# Patient Record
Sex: Female | Born: 1970 | Race: White | Hispanic: No | Marital: Married
Health system: Southern US, Community
[De-identification: ages and names within clinical notes are randomized; demographics above are authoritative.]

---

## 1997-10-08 ENCOUNTER — Other Ambulatory Visit: Admission: RE | Admit: 1997-10-08 | Discharge: 1997-10-08 | Payer: Self-pay | Admitting: Obstetrics and Gynecology

## 1997-12-17 ENCOUNTER — Ambulatory Visit (HOSPITAL_COMMUNITY): Admission: RE | Admit: 1997-12-17 | Discharge: 1997-12-17 | Payer: Self-pay | Admitting: Obstetrics and Gynecology

## 1998-04-01 ENCOUNTER — Inpatient Hospital Stay (HOSPITAL_COMMUNITY): Admission: AD | Admit: 1998-04-01 | Discharge: 1998-04-01 | Payer: Self-pay | Admitting: *Deleted

## 1998-04-04 ENCOUNTER — Inpatient Hospital Stay (HOSPITAL_COMMUNITY): Admission: AD | Admit: 1998-04-04 | Discharge: 1998-04-06 | Payer: Self-pay | Admitting: Obstetrics and Gynecology

## 2001-02-13 ENCOUNTER — Emergency Department (HOSPITAL_COMMUNITY): Admission: EM | Admit: 2001-02-13 | Discharge: 2001-02-13 | Payer: Self-pay | Admitting: *Deleted

## 2019-10-18 ENCOUNTER — Encounter (HOSPITAL_COMMUNITY): Payer: Self-pay

## 2019-10-18 ENCOUNTER — Emergency Department (HOSPITAL_COMMUNITY): Payer: Self-pay

## 2019-10-18 ENCOUNTER — Inpatient Hospital Stay (HOSPITAL_COMMUNITY)
Admission: EM | Admit: 2019-10-18 | Discharge: 2019-10-31 | DRG: 080 | Disposition: E | Payer: Self-pay | Attending: Pulmonary Disease | Admitting: Pulmonary Disease

## 2019-10-18 DIAGNOSIS — Z515 Encounter for palliative care: Secondary | ICD-10-CM | POA: Diagnosis not present

## 2019-10-18 DIAGNOSIS — J9690 Respiratory failure, unspecified, unspecified whether with hypoxia or hypercapnia: Secondary | ICD-10-CM

## 2019-10-18 DIAGNOSIS — I2510 Atherosclerotic heart disease of native coronary artery without angina pectoris: Secondary | ICD-10-CM | POA: Diagnosis present

## 2019-10-18 DIAGNOSIS — A419 Sepsis, unspecified organism: Secondary | ICD-10-CM | POA: Diagnosis not present

## 2019-10-18 DIAGNOSIS — Z529 Donor of unspecified organ or tissue: Secondary | ICD-10-CM

## 2019-10-18 DIAGNOSIS — I610 Nontraumatic intracerebral hemorrhage in hemisphere, subcortical: Secondary | ICD-10-CM | POA: Diagnosis present

## 2019-10-18 DIAGNOSIS — R29810 Facial weakness: Secondary | ICD-10-CM | POA: Diagnosis present

## 2019-10-18 DIAGNOSIS — N390 Urinary tract infection, site not specified: Secondary | ICD-10-CM | POA: Diagnosis not present

## 2019-10-18 DIAGNOSIS — Z526 Liver donor: Secondary | ICD-10-CM

## 2019-10-18 DIAGNOSIS — Z524 Kidney donor: Secondary | ICD-10-CM

## 2019-10-18 DIAGNOSIS — I959 Hypotension, unspecified: Secondary | ICD-10-CM | POA: Diagnosis not present

## 2019-10-18 DIAGNOSIS — Y848 Other medical procedures as the cause of abnormal reaction of the patient, or of later complication, without mention of misadventure at the time of the procedure: Secondary | ICD-10-CM | POA: Diagnosis not present

## 2019-10-18 DIAGNOSIS — G9382 Brain death: Secondary | ICD-10-CM | POA: Diagnosis not present

## 2019-10-18 DIAGNOSIS — R197 Diarrhea, unspecified: Secondary | ICD-10-CM | POA: Diagnosis not present

## 2019-10-18 DIAGNOSIS — J189 Pneumonia, unspecified organism: Secondary | ICD-10-CM

## 2019-10-18 DIAGNOSIS — Z6838 Body mass index (BMI) 38.0-38.9, adult: Secondary | ICD-10-CM

## 2019-10-18 DIAGNOSIS — R1310 Dysphagia, unspecified: Secondary | ICD-10-CM | POA: Diagnosis present

## 2019-10-18 DIAGNOSIS — I629 Nontraumatic intracranial hemorrhage, unspecified: Secondary | ICD-10-CM

## 2019-10-18 DIAGNOSIS — I161 Hypertensive emergency: Secondary | ICD-10-CM | POA: Diagnosis present

## 2019-10-18 DIAGNOSIS — J95851 Ventilator associated pneumonia: Secondary | ICD-10-CM | POA: Diagnosis not present

## 2019-10-18 DIAGNOSIS — B9620 Unspecified Escherichia coli [E. coli] as the cause of diseases classified elsewhere: Secondary | ICD-10-CM | POA: Diagnosis not present

## 2019-10-18 DIAGNOSIS — R29728 NIHSS score 28: Secondary | ICD-10-CM | POA: Diagnosis present

## 2019-10-18 DIAGNOSIS — D751 Secondary polycythemia: Secondary | ICD-10-CM | POA: Diagnosis present

## 2019-10-18 DIAGNOSIS — G936 Cerebral edema: Secondary | ICD-10-CM | POA: Diagnosis present

## 2019-10-18 DIAGNOSIS — G8191 Hemiplegia, unspecified affecting right dominant side: Secondary | ICD-10-CM | POA: Diagnosis present

## 2019-10-18 DIAGNOSIS — E669 Obesity, unspecified: Secondary | ICD-10-CM | POA: Diagnosis present

## 2019-10-18 DIAGNOSIS — I615 Nontraumatic intracerebral hemorrhage, intraventricular: Secondary | ICD-10-CM | POA: Diagnosis present

## 2019-10-18 DIAGNOSIS — Z9911 Dependence on respirator [ventilator] status: Secondary | ICD-10-CM

## 2019-10-18 DIAGNOSIS — E785 Hyperlipidemia, unspecified: Secondary | ICD-10-CM | POA: Diagnosis present

## 2019-10-18 DIAGNOSIS — E876 Hypokalemia: Secondary | ICD-10-CM | POA: Diagnosis present

## 2019-10-18 DIAGNOSIS — J9601 Acute respiratory failure with hypoxia: Secondary | ICD-10-CM | POA: Diagnosis present

## 2019-10-18 DIAGNOSIS — G935 Compression of brain: Principal | ICD-10-CM | POA: Diagnosis present

## 2019-10-18 DIAGNOSIS — B9561 Methicillin susceptible Staphylococcus aureus infection as the cause of diseases classified elsewhere: Secondary | ICD-10-CM | POA: Diagnosis not present

## 2019-10-18 DIAGNOSIS — R030 Elevated blood-pressure reading, without diagnosis of hypertension: Secondary | ICD-10-CM

## 2019-10-18 DIAGNOSIS — Z978 Presence of other specified devices: Secondary | ICD-10-CM

## 2019-10-18 DIAGNOSIS — Z5289 Donor of other specified organs or tissues: Secondary | ICD-10-CM

## 2019-10-18 DIAGNOSIS — I10 Essential (primary) hypertension: Secondary | ICD-10-CM | POA: Diagnosis present

## 2019-10-18 DIAGNOSIS — Z20822 Contact with and (suspected) exposure to covid-19: Secondary | ICD-10-CM | POA: Diagnosis present

## 2019-10-18 DIAGNOSIS — I619 Nontraumatic intracerebral hemorrhage, unspecified: Secondary | ICD-10-CM | POA: Insufficient documentation

## 2019-10-18 DIAGNOSIS — I609 Nontraumatic subarachnoid hemorrhage, unspecified: Secondary | ICD-10-CM | POA: Diagnosis present

## 2019-10-18 DIAGNOSIS — Z66 Do not resuscitate: Secondary | ICD-10-CM | POA: Diagnosis not present

## 2019-10-18 DIAGNOSIS — R339 Retention of urine, unspecified: Secondary | ICD-10-CM | POA: Diagnosis not present

## 2019-10-18 LAB — DIFFERENTIAL
Abs Immature Granulocytes: 0.02 10*3/uL (ref 0.00–0.07)
Basophils Absolute: 0.1 10*3/uL (ref 0.0–0.1)
Basophils Relative: 1 %
Eosinophils Absolute: 0.4 10*3/uL (ref 0.0–0.5)
Eosinophils Relative: 3 %
Immature Granulocytes: 0 %
Lymphocytes Relative: 40 %
Lymphs Abs: 4.7 10*3/uL — ABNORMAL HIGH (ref 0.7–4.0)
Monocytes Absolute: 1 10*3/uL (ref 0.1–1.0)
Monocytes Relative: 8 %
Neutro Abs: 5.5 10*3/uL (ref 1.7–7.7)
Neutrophils Relative %: 48 %

## 2019-10-18 LAB — I-STAT CHEM 8, ED
BUN: 24 mg/dL — ABNORMAL HIGH (ref 6–20)
Calcium, Ion: 0.92 mmol/L — ABNORMAL LOW (ref 1.15–1.40)
Chloride: 105 mmol/L (ref 98–111)
Creatinine, Ser: 1 mg/dL (ref 0.44–1.00)
Glucose, Bld: 110 mg/dL — ABNORMAL HIGH (ref 70–99)
HCT: 48 % — ABNORMAL HIGH (ref 36.0–46.0)
Hemoglobin: 16.3 g/dL — ABNORMAL HIGH (ref 12.0–15.0)
Potassium: 3.8 mmol/L (ref 3.5–5.1)
Sodium: 137 mmol/L (ref 135–145)
TCO2: 25 mmol/L (ref 22–32)

## 2019-10-18 LAB — APTT: aPTT: 28 seconds (ref 24–36)

## 2019-10-18 LAB — CBC
HCT: 48.9 % — ABNORMAL HIGH (ref 36.0–46.0)
Hemoglobin: 15.5 g/dL — ABNORMAL HIGH (ref 12.0–15.0)
MCH: 31 pg (ref 26.0–34.0)
MCHC: 31.7 g/dL (ref 30.0–36.0)
MCV: 97.8 fL (ref 80.0–100.0)
Platelets: 235 10*3/uL (ref 150–400)
RBC: 5 MIL/uL (ref 3.87–5.11)
RDW: 14.7 % (ref 11.5–15.5)
WBC: 11.6 10*3/uL — ABNORMAL HIGH (ref 4.0–10.5)
nRBC: 0 % (ref 0.0–0.2)

## 2019-10-18 LAB — PROTIME-INR
INR: 1 (ref 0.8–1.2)
Prothrombin Time: 12.8 seconds (ref 11.4–15.2)

## 2019-10-18 LAB — CBG MONITORING, ED: Glucose-Capillary: 94 mg/dL (ref 70–99)

## 2019-10-18 LAB — I-STAT BETA HCG BLOOD, ED (MC, WL, AP ONLY): I-stat hCG, quantitative: <5 m[IU]/mL (ref <5)

## 2019-10-18 MED ORDER — ROCURONIUM BROMIDE 50 MG/5ML IV SOLN
INTRAVENOUS | Status: AC | PRN
  Administered 2019-10-18: 100 mg via INTRAVENOUS

## 2019-10-18 MED ORDER — LABETALOL HCL 5 MG/ML IV SOLN
10.0000 mg | Freq: Once | INTRAVENOUS | Status: AC
  Administered 2019-10-18: 10 mg via INTRAVENOUS

## 2019-10-18 MED ORDER — ETOMIDATE 2 MG/ML IV SOLN
INTRAVENOUS | Status: AC | PRN
  Administered 2019-10-18: 30 mg via INTRAVENOUS

## 2019-10-18 MED ORDER — PROPOFOL 1000 MG/100ML IV EMUL
5.0000 ug/kg/min | INTRAVENOUS | Status: DC
  Administered 2019-10-19: 04:00:00 25 ug/kg/min via INTRAVENOUS
  Filled 2019-10-18: qty 100

## 2019-10-18 MED ORDER — CLEVIDIPINE BUTYRATE 0.5 MG/ML IV EMUL: 0.0000 mg/h | INTRAVENOUS | Status: DC

## 2019-10-18 MED ORDER — PROPOFOL 1000 MG/100ML IV EMUL
INTRAVENOUS | Status: AC
  Administered 2019-10-18: 20 ug/kg/min via INTRAVENOUS
  Filled 2019-10-18: qty 100

## 2019-10-18 NOTE — ED Triage Notes (Signed)
Pt came in [facility] Code Stroke. Pt LSW was at 2215 . When EMS arrived, the noted patient was sweaty and presents with right sided weakness and Right Sided Facial Droop. EMS reports patient was hard to arouse and had pinpoint pupils. EMS gave 2mg  Narcan through 20G IV placed by EMS in the Left Wrist. After, patient became more arousal and responds to pain without speaking or following commands.

## 2019-10-18 NOTE — ED Notes (Addendum)
Cleviprex delayed d/t intubation and unable to get IV lines.

## 2019-10-18 NOTE — ED Provider Notes (Signed)
[facility] EMERGENCY DEPARTMENT Provider Note   CSN: [id number] Arrival date & time: 10/04/2019  2309     History Chief complaint - altered mental status  Level 5 caveat due to altered mental Debra Huff is a 49 y.o. female.   Altered Mental Status Presenting symptoms: lethargy   Severity:  Severe Most recent episode:  Today Episode history:  Single Timing:  Constant Progression:  Unchanged Chronicity:  New Patient presents for altered mental status.  Patient presents via [facility].  Patient last known well at 2215.  EMS arrival patient was sweaty and appeared to have a right facial droop.  Patient was difficult to arouse and had pinpoint pupils.  Narcan was given with some improvement. No other details noted this time.  Patient arrived as a code stroke alert      PMH- unknown Soc hx -unknown OB History   No obstetric history on file.     No family history on file.  Social History   Tobacco Use  . Smoking status: Not on file  Substance Use Topics  . Alcohol use: Not on file  . Drug use: Not on file    Home Medications Prior to Admission medications   Not on File    Allergies    Patient has no allergy information on record.  Review of Systems   Review of Systems  Unable to perform ROS: Mental status change    Physical Exam Updated Vital Signs BP (!) 263/188   Pulse 91   Resp 18   Wt 103.8 kg   SpO2 100%   Physical Exam CONSTITUTIONAL: Unresponsive HEAD: Normocephalic/atraumatic, no signs of trauma EYES: Pupils pinpoint and equal bilaterally ENMT: Mucous membranes moist, edentulous NECK: supple no meningeal signs CV: S1/S2 noted, no murmurs/rubs/gallops noted LUNGS: Lungs are clear to auscultation bilaterally, no apparent distress ABDOMEN: soft, nontender, obese NEURO: Pt is somnolent.  GCS 9-patient can localize pain.  EXTREMITIES: pulses normal/equal, full ROM SKIN: warm, color normal PSYCH: Unable to assess  ED  Results / Procedures / Treatments   Labs (all labs ordered are listed, but only abnormal results are displayed) Labs Reviewed  CBC - Abnormal; Notable for the following components:      Result Value   WBC 11.6 (*)    Hemoglobin 15.5 (*)    HCT 48.9 (*)    All other components within normal limits  DIFFERENTIAL - Abnormal; Notable for the following components:   Lymphs Abs 4.7 (*)    All other components within normal limits  COMPREHENSIVE METABOLIC PANEL - Abnormal; Notable for the following components:   CO2 21 (*)    Glucose, Bld 104 (*)    BUN 21 (*)    All other components within normal limits  I-STAT CHEM 8, ED - Abnormal; Notable for the following components:   BUN 24 (*)    Glucose, Bld 110 (*)    Calcium, Ion 0.92 (*)    Hemoglobin 16.3 (*)    HCT 48.0 (*)    All other components within normal limits  RESPIRATORY PANEL BY RT PCR (FLU A&B, COVID)  PROTIME-INR  APTT  ETHANOL  RAPID URINE DRUG SCREEN, HOSP PERFORMED  URINALYSIS, ROUTINE W REFLEX MICROSCOPIC  AMMONIA  URINE DRUGS OF ABUSE SCREEN W ALC, ROUTINE (REF LAB)  HIV ANTIBODY (ROUTINE TESTING W REFLEX)  I-STAT BETA HCG BLOOD, ED ([facility], [facility], [facility] ONLY)  CBG MONITORING, ED    EKG EKG Interpretation  Date/Time:  Sunday October 18 2019 23:33:24 EDT Ventricular Rate:  82 PR Interval:    QRS Duration: 106 QT Interval:  430 QTC Calculation: 503 R Axis:   30 Text Interpretation: Sinus rhythm LAE, consider biatrial enlargement LVH with secondary repolarization abnormality Anterior ST elevation, probably due to LVH Borderline prolonged QT interval No previous ECGs available Confirmed by Ripley Fraise [phone]) on 10/30/2019 11:51:18 PM   Radiology CT HEAD CODE STROKE WO CONTRAST  Result Date: 10/14/2019 CLINICAL DATA:  Code stroke.  Left facial droop EXAM: CT HEAD WITHOUT CONTRAST TECHNIQUE: Contiguous axial images were obtained from the base of the skull through the vertex without intravenous contrast. COMPARISON:   Head CT 03/24/2015 FINDINGS: Examination degraded by motion. Brain: There is a large intraparenchymal hematoma centered in the left basal ganglia measuring 5.0 x 3.0 x 4.1 cm (volume = 32 cm^3). There is subarachnoid extension into the lateral ventricles and inferiorly into the fourth ventricle. There is entrapment of the right lateral ventricle with dilatation of atrium and temporal horn. There is rightward midline shift at the level of the foramina of [place] that measures 6 mm. Vascular: No abnormal hyperdensity of the major intracranial arteries or dural venous sinuses. No intracranial atherosclerosis. Skull: The visualized skull base, calvarium and extracranial soft tissues are normal. Sinuses/Orbits: No fluid levels or advanced mucosal thickening of the visualized paranasal sinuses. No mastoid or middle ear effusion. The orbits are normal. IMPRESSION: 1. 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 2. Right lateral ventricle entrapment with dilatation of the atrium and temporal horn. *Dr. Kerney Elbe paged at 11:34 p.m. on 10/15/2019. Electronically Signed   By: Ulyses Jarred M.D.   On: 10/12/2019 23:34    Procedures .Critical Care Performed by: Ripley Fraise, MD Authorized by: Ripley Fraise, MD   Critical care provider statement:    Critical care time (minutes):  35   Critical care start time:  10/20/2019 11:15 PM   Critical care end time:  10/02/2019 11:50 PM   Critical care was necessary to treat or prevent imminent or life-threatening deterioration of the following conditions:  CNS failure or compromise and respiratory failure   Critical care was time spent personally by me on the following activities:  Ordering and review of laboratory studies, ordering and performing treatments and interventions, ordering and review of radiographic studies, pulse oximetry, re-evaluation of patient's condition, examination  of patient, discussions with consultants and evaluation of patient's response to treatment   I assumed direction of critical care for this patient from another provider in my specialty: no   Procedure Name: Intubation Date/Time: 10/09/2019 11:23 PM Performed by: Ripley Fraise, MD Pre-anesthesia Checklist: Patient identified, Emergency Drugs available, Suction available and Patient being monitored Oxygen Delivery Method: Non-rebreather mask Preoxygenation: Pre-oxygenation with 100% oxygen Induction Type: Rapid sequence Laryngoscope Size: Mac and 4 Grade View: Grade I Tube size: 7.5 mm Number of attempts: 1 Airway Equipment and Method: Stylet Placement Confirmation: ETT inserted through vocal cords under direct vision,  CO2 detector and Breath sounds checked- equal and bilateral Secured at: 22 cm Tube secured with: ETT holder    IO LINE INSERTION  Date/Time: 10/19/2019 12:16 AM Performed by: Ripley Fraise, MD Authorized by: Ripley Fraise, MD   Consent:    Consent obtained:  Emergent situation Pre-procedure details:    Site preparation:  Alcohol   Preparation: Patient was prepped and draped in usual sterile fashion   Anesthesia (see MAR for exact dosages):  Anesthesia method:  None Procedure details:    Insertion site:  R proximal tibia   Insertion device:  Drill device   Insertion: Needle was inserted through the bony cortex     Number of attempts:  1   Insertion confirmation:  Aspiration of blood/marrow, easy infusion of fluids and stability of the needle Post-procedure details:    Secured with:  Protective shield   Patient tolerance of procedure:  Tolerated well, no immediate complications     Medications Ordered in ED Medications  propofol (DIPRIVAN) 1000 MG/100ML infusion (20 mcg/kg/min  103.8 kg Intravenous New Bag/Given 10/30/2019 2341)   stroke: mapping our early stages of recovery book (has no administration in time range)  acetaminophen (TYLENOL) tablet  650 mg (has no administration in time range)    Or  acetaminophen (TYLENOL) 160 MG/5ML solution 650 mg (has no administration in time range)    Or  acetaminophen (TYLENOL) suppository 650 mg (has no administration in time range)  senna-docusate (Senokot-S) tablet 1 tablet (1 tablet Oral Not Given 10/19/19 0004)  pantoprazole (PROTONIX) injection 40 mg (has no administration in time range)  labetalol (NORMODYNE) injection 20 mg (0 mg Intravenous Hold 10/19/19 0007)    And  clevidipine (CLEVIPREX) infusion 0.5 mg/mL (8 mg/hr Intravenous Rate/Dose Change 10/19/19 0015)  labetalol (NORMODYNE) injection 10 mg (10 mg Intravenous Given 10/30/2019 2333)  etomidate (AMIDATE) injection (30 mg Intravenous Given 10/24/2019 2337)  rocuronium (ZEMURON) injection (100 mg Intravenous Given 10/20/2019 2337)    ED Course  I have reviewed the triage vital signs and the nursing notes.  Pertinent labs & imaging results that were available during my care of the patient were reviewed by me and considered in my medical decision making (see chart for details).    MDM Rules/Calculators/A&P                      Seen on arrival for altered mental status and concern for stroke.  Patient was somnolent but arousable to voice and pain on arrival.  She went immediately to CT head for code stroke protocol. CT head reveals large intraparenchymal hemorrhage with subarachnoid extension.  On repeat assessment patient is GCS approximately 8-9 Patient was intubated for airway protection due to significant retraction cranial hemorrhage Discussed the case with Dr. Cheral Marker with neurology.  He has also consulted neurosurgery to evaluate patient for clot extraction or external drain Patient is critically ill at this time 12:17 AM Due to lack of IV access, emergent intraosseous line was placed in right tibia This was placed without difficulty.  This allowed patient to received IV antihypertensives Patient will be admitted to the neurology  service Critical care at bedside to evaluate patient.   This patient presents to the ED for concern of altered mental status and weakness, this involves an extensive number of treatment options, and is a complaint that carries with it a high risk of complications and morbidity.  The differential diagnosis includes stroke, intracranial hemorrhage, overdose, hypoglycemia   Lab Tests:   I Ordered, reviewed, and interpreted labs, which included metabolic panel, complete blood count, glucose level, urinalysis  Medicines ordered:   I ordered medication propofol Cleviprex ordered for sedation and hypertension  Imaging Studies ordered:   I ordered imaging studies which included CT head and chest x-ray  I independently visualized and interpreted imaging which showed CT head showed large internal cerebral hemorrhage     Consultations Obtained:   I consulted neurosurgery and neurology and discussed  lab and imaging findings  Reevaluation:  After the interventions stated above, I reevaluated the patient and found patient is critically ill  Critical Interventions:  . Intubation  Final Clinical Impression(s) / ED Diagnoses Final diagnoses:  Intracranial hemorrhage [facility])    Rx / DC Orders ED Discharge Orders    None       Ripley Fraise, MD 10/19/19 [phone]

## 2019-10-18 NOTE — Consult Note (Signed)
Referring Physician: Dr. Bebe Shaggy    Chief Complaint: Acute onset of right sided weakness, right facial droop and unresponsiveness  HPI: Debra Huff is an 49 y.o. female presenting via EMS after acute onset of right sided weakness, right facial droop and unresponsiveness while at home. EMS was called. She was diaphoretic, leaning to the right and unresponsive. Was twitching all over, low amplitude. Pupils were pinpoint. Narcan 2 mg given by EMS, was more arousable afterwards but still not speaking - pupils were still pinpoint. Lost control of bladder en route. She is not on a blood thinner per EMS.   SBP on arrival was 260.   CT head in the ED revealed a large acute left basal ganglia ICH with mass effect and intraventricular extension.    LSN: [id number] GCS: 6 ICH score: 3  No past medical history on file.   No family history on file. Social History:  has no history on file for tobacco, alcohol, and drug.  Allergies: Not on File  Home Medications:  Per EMS, they were informed that the patient is not on any medications  ROS: Unable to obtain due to unresponsiveness  Physical Examination: There were no vitals taken for this visit.  HEENT: Elbe/AT Lungs: Sonorous respirations Ext: No edema  Neurologic Examination: Ment: No eye opening. Withdraws to noxious only. Nonverbal. No attempts to communicate. Not following commands. GCS 6.  CN: Pupils pinpoint. No blink to threat. Eyes conjugate near the midline. Right facial droop.  Motor/Sensory: RUE with spontaneous finger movements, but will not elevate against gravity. Spontaneously lifting LUE and attempting to remove oxygen. BLE withdrawing to noxious, right weaker than left.  Reflexes: Noncontributory post-intubation with sedation Cerebellar/Gait: Unable to assess  No results found for this or any previous visit (from the past 48 hour(s)). No results found.  Assessment: 49 y.o. female presenting with acute onset of  unresponsiveness at home. CT reveals an acute left BG hemorrhage, most likely hypertensive.  1. 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 Right lateral ventricle entrapment with dilatation of the atrium and temporal horn. 2. Exam is consistent with the above findings.   Plan: 1.STAT Neurosurgery consulted for possible clot evacuation. Neurosurgery has determined that the risks of surgical intervention outweigh potential benefits.  2. Being emergently intubated in the ED for airway protection.  3. Have consulted CCM for vent management  4. After she is stabilized, we will admit to the ICU under the Neurology service.  5. Urine toxicology screen  6. Hypertonic saline at 50 cc/hr.  7. MRI/MRA of head 8. PT consult, OT consult, Speech consult 9. Cardiac telemetry 10. Frequent neuro checks 11. BP management with clevidipine drip  12. No antiplatelet medications or anticoagulants. DVT prophylaxis with SCDs 13. Repeat CT head at 6 AM.   60 minutes spent in the emergent neurological evaluation and management of this critically ill patient  @Electronically  signed: Dr.  10/04/2019, 11:16 PM

## 2019-10-19 ENCOUNTER — Inpatient Hospital Stay (HOSPITAL_COMMUNITY): Payer: Self-pay

## 2019-10-19 ENCOUNTER — Other Ambulatory Visit (HOSPITAL_COMMUNITY): Payer: Self-pay

## 2019-10-19 DIAGNOSIS — G911 Obstructive hydrocephalus: Secondary | ICD-10-CM

## 2019-10-19 DIAGNOSIS — I6389 Other cerebral infarction: Secondary | ICD-10-CM

## 2019-10-19 DIAGNOSIS — I629 Nontraumatic intracranial hemorrhage, unspecified: Secondary | ICD-10-CM

## 2019-10-19 DIAGNOSIS — I160 Hypertensive urgency: Secondary | ICD-10-CM

## 2019-10-19 DIAGNOSIS — F1510 Other stimulant abuse, uncomplicated: Secondary | ICD-10-CM

## 2019-10-19 DIAGNOSIS — J9690 Respiratory failure, unspecified, unspecified whether with hypoxia or hypercapnia: Secondary | ICD-10-CM

## 2019-10-19 DIAGNOSIS — R030 Elevated blood-pressure reading, without diagnosis of hypertension: Secondary | ICD-10-CM

## 2019-10-19 DIAGNOSIS — I161 Hypertensive emergency: Secondary | ICD-10-CM

## 2019-10-19 DIAGNOSIS — I615 Nontraumatic intracerebral hemorrhage, intraventricular: Secondary | ICD-10-CM

## 2019-10-19 DIAGNOSIS — J9601 Acute respiratory failure with hypoxia: Secondary | ICD-10-CM

## 2019-10-19 DIAGNOSIS — I619 Nontraumatic intracerebral hemorrhage, unspecified: Secondary | ICD-10-CM | POA: Insufficient documentation

## 2019-10-19 DIAGNOSIS — J9602 Acute respiratory failure with hypercapnia: Secondary | ICD-10-CM

## 2019-10-19 LAB — GLUCOSE, CAPILLARY
Glucose-Capillary: 138 mg/dL — ABNORMAL HIGH (ref 70–99)
Glucose-Capillary: 149 mg/dL — ABNORMAL HIGH (ref 70–99)
Glucose-Capillary: 155 mg/dL — ABNORMAL HIGH (ref 70–99)
Glucose-Capillary: 155 mg/dL — ABNORMAL HIGH (ref 70–99)
Glucose-Capillary: 160 mg/dL — ABNORMAL HIGH (ref 70–99)
Glucose-Capillary: 187 mg/dL — ABNORMAL HIGH (ref 70–99)

## 2019-10-19 LAB — RESPIRATORY PANEL BY RT PCR (FLU A&B, COVID)
Influenza A by PCR: NEGATIVE
Influenza B by PCR: NEGATIVE
SARS Coronavirus 2 by RT PCR: NEGATIVE

## 2019-10-19 LAB — URINALYSIS, ROUTINE W REFLEX MICROSCOPIC
Bilirubin Urine: NEGATIVE
Glucose, UA: 50 mg/dL — AB
Hgb urine dipstick: NEGATIVE
Ketones, ur: NEGATIVE mg/dL
Leukocytes,Ua: NEGATIVE
Nitrite: NEGATIVE
Protein, ur: 100 mg/dL — AB
Specific Gravity, Urine: 1.01 (ref 1.005–1.030)
pH: 7 (ref 5.0–8.0)

## 2019-10-19 LAB — AMMONIA: Ammonia: 35 umol/L (ref 9–35)

## 2019-10-19 LAB — SODIUM
Sodium: 141 mmol/L (ref 135–145)
Sodium: 143 mmol/L (ref 135–145)
Sodium: 147 mmol/L — ABNORMAL HIGH (ref 135–145)

## 2019-10-19 LAB — COMPREHENSIVE METABOLIC PANEL
ALT: 22 U/L (ref 0–44)
AST: 25 U/L (ref 15–41)
Albumin: 3.8 g/dL (ref 3.5–5.0)
Alkaline Phosphatase: 82 U/L (ref 38–126)
Anion gap: 12 (ref 5–15)
BUN: 21 mg/dL — ABNORMAL HIGH (ref 6–20)
CO2: 21 mmol/L — ABNORMAL LOW (ref 22–32)
Calcium: 9.2 mg/dL (ref 8.9–10.3)
Chloride: 106 mmol/L (ref 98–111)
Creatinine, Ser: 0.98 mg/dL (ref 0.44–1.00)
GFR calc Af Amer: >60 mL/min (ref >60)
GFR calc non Af Amer: >60 mL/min (ref >60)
Glucose, Bld: 104 mg/dL — ABNORMAL HIGH (ref 70–99)
Potassium: 4.4 mmol/L (ref 3.5–5.1)
Sodium: 139 mmol/L (ref 135–145)
Total Bilirubin: 0.6 mg/dL (ref 0.3–1.2)
Total Protein: 7.5 g/dL (ref 6.5–8.1)

## 2019-10-19 LAB — MRSA PCR SCREENING: MRSA by PCR: NEGATIVE

## 2019-10-19 LAB — RAPID URINE DRUG SCREEN, HOSP PERFORMED
Amphetamines: POSITIVE — AB
Barbiturates: NOT DETECTED
Benzodiazepines: NOT DETECTED
Cocaine: NOT DETECTED
Opiates: NOT DETECTED
Tetrahydrocannabinol: NOT DETECTED

## 2019-10-19 LAB — ECHOCARDIOGRAM COMPLETE
Height: 65 in
Weight: 3661.4 oz

## 2019-10-19 LAB — ETHANOL: Alcohol, Ethyl (B): <10 mg/dL (ref <10)

## 2019-10-19 LAB — HIV ANTIBODY (ROUTINE TESTING W REFLEX): HIV Screen 4th Generation wRfx: NONREACTIVE

## 2019-10-19 LAB — LACTIC ACID, PLASMA: Lactic Acid, Venous: 1.9 mmol/L (ref 0.5–1.9)

## 2019-10-19 MED ORDER — LABETALOL HCL 5 MG/ML IV SOLN
5.0000 mg | INTRAVENOUS | Status: DC | PRN
  Administered 2019-10-19 – 2019-10-20 (×2): 5 mg via INTRAVENOUS
  Filled 2019-10-19 (×2): qty 4

## 2019-10-19 MED ORDER — LABETALOL HCL 5 MG/ML IV SOLN
0.5000 mg/min | INTRAVENOUS | Status: DC
  Administered 2019-10-19 (×2): 0.5 mg/min via INTRAVENOUS
  Administered 2019-10-20: 06:00:00 2 mg/min via INTRAVENOUS
  Administered 2019-10-20: 09:00:00 1 mg/min via INTRAVENOUS
  Filled 2019-10-19 (×3): qty 100

## 2019-10-19 MED ORDER — ACETAMINOPHEN 650 MG RE SUPP: 650.0000 mg | RECTAL | Status: DC | PRN

## 2019-10-19 MED ORDER — LABETALOL HCL 5 MG/ML IV SOLN
20.0000 mg | Freq: Once | INTRAVENOUS | Status: AC
  Administered 2019-10-19: 10 mg via INTRAVENOUS
  Filled 2019-10-19: qty 4

## 2019-10-19 MED ORDER — SODIUM CHLORIDE 23.4 % INJECTION (4 MEQ/ML) FOR IV ADMINISTRATION
120.0000 meq | Freq: Once | INTRAVENOUS | Status: AC
  Administered 2019-10-19: 14:00:00 120 meq via INTRAVENOUS
  Filled 2019-10-19: qty 30

## 2019-10-19 MED ORDER — SENNOSIDES-DOCUSATE SODIUM 8.6-50 MG PO TABS
1.0000 | ORAL_TABLET | Freq: Two times a day (BID) | ORAL | Status: DC
  Filled 2019-10-19: qty 1

## 2019-10-19 MED ORDER — SENNOSIDES-DOCUSATE SODIUM 8.6-50 MG PO TABS
1.0000 | ORAL_TABLET | Freq: Two times a day (BID) | ORAL | Status: DC
  Administered 2019-10-19 – 2019-10-23 (×10): 1
  Filled 2019-10-19 (×9): qty 1

## 2019-10-19 MED ORDER — ACETAMINOPHEN 325 MG PO TABS: 650.0000 mg | ORAL_TABLET | ORAL | Status: DC | PRN

## 2019-10-19 MED ORDER — CLEVIDIPINE BUTYRATE 0.5 MG/ML IV EMUL
0.0000 mg/h | INTRAVENOUS | Status: DC
  Administered 2019-10-19: 17:00:00 21 mg/h via INTRAVENOUS
  Administered 2019-10-19: 1 mg/h via INTRAVENOUS
  Administered 2019-10-19 (×6): 21 mg/h via INTRAVENOUS
  Administered 2019-10-19: 20 mg/h via INTRAVENOUS
  Administered 2019-10-19: 22:00:00 18 mg/h via INTRAVENOUS
  Administered 2019-10-19: 04:00:00 21 mg/h via INTRAVENOUS
  Administered 2019-10-20 (×2): 15 mg/h via INTRAVENOUS
  Administered 2019-10-20 (×3): 21 mg/h via INTRAVENOUS
  Administered 2019-10-20: 12:00:00 17 mg/h via INTRAVENOUS
  Administered 2019-10-20: 21 mg/h via INTRAVENOUS
  Administered 2019-10-21: 02:00:00 8 mg/h via INTRAVENOUS
  Administered 2019-10-21 (×2): 21 mg/h via INTRAVENOUS
  Administered 2019-10-21: 06:00:00 18 mg/h via INTRAVENOUS
  Administered 2019-10-21: 11 mg/h via INTRAVENOUS
  Administered 2019-10-22: 14 mg/h via INTRAVENOUS
  Administered 2019-10-22: 13:00:00 12 mg/h via INTRAVENOUS
  Administered 2019-10-22: 22:00:00 14 mg/h via INTRAVENOUS
  Administered 2019-10-23: 18:00:00 15 mg/h via INTRAVENOUS
  Administered 2019-10-23: 05:00:00 8 mg/h via INTRAVENOUS
  Administered 2019-10-23: 22:00:00 12 mg/h via INTRAVENOUS
  Administered 2019-10-23 (×2): 18 mg/h via INTRAVENOUS
  Administered 2019-10-24: 12:00:00 12 mg/h via INTRAVENOUS
  Administered 2019-10-24: 04:00:00 6 mg/h via INTRAVENOUS
  Administered 2019-10-24: 15:00:00 10 mg/h via INTRAVENOUS
  Filled 2019-10-19 (×15): qty 50
  Filled 2019-10-19: qty 100
  Filled 2019-10-19 (×6): qty 50
  Filled 2019-10-19: qty 100
  Filled 2019-10-19 (×11): qty 50

## 2019-10-19 MED ORDER — LABETALOL HCL 5 MG/ML IV SOLN: 10.0000 mg | Freq: Once | INTRAVENOUS | Status: AC

## 2019-10-19 MED ORDER — ACETAMINOPHEN 160 MG/5ML PO SOLN
650.0000 mg | ORAL | Status: DC | PRN
  Administered 2019-10-19 – 2019-10-27 (×21): 650 mg
  Filled 2019-10-19 (×21): qty 20.3

## 2019-10-19 MED ORDER — STROKE: EARLY STAGES OF RECOVERY BOOK
Freq: Once | Status: AC
  Filled 2019-10-19: qty 1

## 2019-10-19 MED ORDER — CHLORHEXIDINE GLUCONATE 0.12% ORAL RINSE (MEDLINE KIT)
15.0000 mL | Freq: Two times a day (BID) | OROMUCOSAL | Status: DC
  Administered 2019-10-19 – 2019-10-27 (×18): 15 mL via OROMUCOSAL

## 2019-10-19 MED ORDER — PANTOPRAZOLE SODIUM 40 MG IV SOLR
40.0000 mg | Freq: Every day | INTRAVENOUS | Status: DC
  Administered 2019-10-19 – 2019-10-26 (×8): 40 mg via INTRAVENOUS
  Filled 2019-10-19 (×9): qty 40

## 2019-10-19 MED ORDER — SODIUM CHLORIDE 3 % IV SOLN
INTRAVENOUS | Status: DC
  Administered 2019-10-19 – 2019-10-20 (×4): 75 mL/h via INTRAVENOUS
  Administered 2019-10-20 – 2019-10-22 (×6): 50 mL/h via INTRAVENOUS
  Filled 2019-10-19 (×12): qty 500

## 2019-10-19 MED ORDER — ORAL CARE MOUTH RINSE
15.0000 mL | OROMUCOSAL | Status: DC
  Administered 2019-10-19 – 2019-10-27 (×84): 15 mL via OROMUCOSAL

## 2019-10-19 MED ORDER — CHLORHEXIDINE GLUCONATE CLOTH 2 % EX PADS
6.0000 | MEDICATED_PAD | Freq: Every day | CUTANEOUS | Status: DC
  Administered 2019-10-19 – 2019-10-27 (×9): 6 via TOPICAL

## 2019-10-19 MED ORDER — SODIUM CHLORIDE 3 % IV SOLN
INTRAVENOUS | Status: DC
  Administered 2019-10-19: 03:00:00 50 mL/h via INTRAVENOUS
  Filled 2019-10-19 (×2): qty 500

## 2019-10-19 NOTE — ED Notes (Addendum)
Pt is receiving cleviprex through IO line

## 2019-10-19 NOTE — Consult Note (Signed)
NAME:  Debra Huff, MRN:  [id number], DOB:  Apr 13, 1971, LOS: 0 ADMISSION DATE:  10/07/2019, CONSULTATION DATE:  10/19/19 REFERRING MD:  Cheral Marker, CHIEF COMPLAINT:  Hemorrhagic CVA   Brief History   49 y.o. F with uncontrolled HTN who presented from home with AMS and R sided facial droop.  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  Pt was intubated and PCCM consulted for vent management.  History of present illness   Debra Huff is a 49 y.o. F with HTN for which she has not sought medical care or been taking medications for years per her sister.  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   Seen by neurology and neurosurgery and deemed not a surgical candidate.  Intubated and PCCM consulted for vent management.   Extremely hypertensive 269/136 and started on Cleviprex gtt.  Past Medical History  HTN  Significant Hospital Events   4/18 Admit to ICU, neurology primary  Consults:  Neurosurgery  PCCM  Procedures:  4/19 ETT 4/19 R IJ CVC  Significant Diagnostic Tests:  10/19/19 CT head>> Large intraparenchymal hematoma centered in the left basal ganglia with subarachnoid extension and 6 mm rightward midlinesShift. Right lateral ventricle entrapment with dilatation of the atrium and temporal horn.  Micro Data:  10/19/19 Sars-Cov-2>>negative   Antimicrobials:    Interim history/subjective:  Pt intubated with Roc and Etomidate,  Therefore unresponsive on eval  Objective   Blood pressure (!) 161/87, pulse 87, temperature 98.9 F (37.2 C), temperature source Oral, resp. rate 19, weight 103.8 kg, SpO2 100 %.    Vent Mode: PRVC FiO2 (%):  [100 %] 100 % Set Rate:  [18 bmp] 18 bmp Vt Set:  [500 mL] 500 mL PEEP:  [5 cmH20] 5 cmH20 Plateau Pressure:  [24 cmH20] 24 cmH20  No intake or output data in the 24 hours ending 10/19/19  0111 Filed Weights   10/30/2019 2321  Weight: 103.8 kg   General:  Well-nourished F intubated and sedated  HEENT: MM pink/moist Neuro: pupils equal and responsive, otherwise unresponsive CV: s1s2 rrr, no m/r/g PULM:  CTAB GI: soft, bsx4 active  Extremities: warm/dry, no edema  Skin: no rashes or lesions   Resolved Hospital Problem list   NA  Assessment & Plan:    This is a 49 yo with history of uncontrolled HTN and questionable drug use who presents with right sided weakness and altered mental status found to have basal ganglia ICH.   Basal ganglia ICH-Not on AC at home -Cleviprex to maintain Sytolic between 893-810 per neuro -NSG has evaluated patient and deemed inoperable -Continue full Supportive care for now. Have placed central line to help with this  Respiratory failure-Pateint unable to protect airway and intubated in ED -Continue Propofol for sedation -TV 6-8 cc/kg. Lung protective ventilation -Wean FiO2 to maintain SPo2 greater than 90% -Check ABG now -Will retract ET tube by 2 cm -PCCM will continue to follow. Extubation will likely be precluded by mental status however can try SBT's and see how patient does.   Best practice:  Diet: NPO Pain/Anxiety/Delirium protocol (if indicated): propofol drip VAP protocol (if indicated): HOB at 30 degrees DVT prophylaxis: No DVT prophylaxis indicated given ICH GI prophylaxis: Protonix Glucose control: CBG scheduled pending this can add insulin if needed Mobility: NA Code Status: Full code Family Communication: Called to sister at bedside and  updated Disposition: ICU for now  Labs   CBC: Recent Labs  Lab 10/17/2019 2319 10/14/2019 2326  WBC 11.6*  --   NEUTROABS 5.5  --   HGB 15.5* 16.3*  HCT 48.9* 48.0*  MCV 97.8  --   PLT 235  --     Basic Metabolic Panel: Recent Labs  Lab 10/04/2019 2319 10/28/2019 2326  NA 139 137  K 4.4 3.8  CL 106 105  CO2 21*  --   GLUCOSE 104* 110*  BUN 21* 24*  CREATININE 0.98  1.00  CALCIUM 9.2  --    GFR: CrCl cannot be calculated (Unknown ideal weight.). Recent Labs  Lab 10/09/2019 2319  WBC 11.6*    Liver Function Tests: Recent Labs  Lab 10/06/2019 2319  AST 25  ALT 22  ALKPHOS 82  BILITOT 0.6  PROT 7.5  ALBUMIN 3.8   No results for input(s): LIPASE, AMYLASE in the last 168 hours. No results for input(s): AMMONIA in the last 168 hours.  ABG    Component Value Date/Time   TCO2 25 10/02/2019 2326     Coagulation Profile: Recent Labs  Lab 10/28/2019 2319  INR 1.0    Cardiac Enzymes: No results for input(s): CKTOTAL, CKMB, CKMBINDEX, TROPONINI in the last 168 hours.  HbA1C: No results found for: HGBA1C  CBG: Recent Labs  Lab 10/26/2019 2310  GLUCAP 94    Review of Systems:   Unable to attain given patients mental status  Past Medical History  She,  has no past medical history on file.   Surgical History   History reviewed. No pertinent surgical history.   Social History      Family History   Her family history is not on file.   Allergies Not on File   Home Medications  Prior to Admission medications   Not on File     Critical care time: 60 mintues

## 2019-10-19 NOTE — ED Notes (Addendum)
Neruosurg at bedside. CCM at bedside.

## 2019-10-19 NOTE — Social Work (Signed)
CSW was unable to complete sbirt due to pt being on vent. Please re consult at more appropriate time.   Larya Charpentier, LCSWA, LCASA Clinical Social Worker [phone]  

## 2019-10-19 NOTE — ED Notes (Signed)
Plan to switch cleviprex to central line once established by CCM

## 2019-10-19 NOTE — Consult Note (Signed)
Reason for Consult: Intracerebral hemorrhage Referring Physician: Neurology  Debra Huff is an 49 y.o. female.  HPI: 49 year old female with a year long history of uncontrollable hypertension that she has not sought medical attention for became unresponsive earlier this evening.  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  History reviewed. No pertinent past medical history.  History reviewed. No pertinent surgical history.  No family history on file.  Social History:  has no history on file for tobacco, alcohol, and drug.  Allergies: Not on File  Medications: I have reviewed the patient's current medications.  Results for orders placed or performed during the hospital encounter of 10/17/2019 (from the past 48 hour(s))  CBG monitoring, ED     Status: None   Collection Time: 10/04/2019 11:10 PM  Result Value Ref Range   Glucose-Capillary 94 70 - 99 mg/dL    Comment: Glucose reference range applies only to samples taken after fasting for at least 8 hours.  Protime-INR     Status: None   Collection Time: 10/30/2019 11:19 PM  Result Value Ref Range   Prothrombin Time 12.8 11.4 - 15.2 seconds   INR 1.0 0.8 - 1.2    Comment: (NOTE) INR goal varies based on device and disease states. Performed at [facility], [address] [place], [place] [address]   APTT     Status: None   Collection Time: 10/20/2019 11:19 PM  Result Value Ref Range   aPTT 28 24 - 36 seconds    Comment: Performed at [facility], [address] [place], [place] [address]  CBC     Status: Abnormal   Collection Time: 10/25/2019 11:19 PM  Result Value Ref Range   WBC 11.6 (H) 4.0 - 10.5 K/uL   RBC 5.00 3.87 - 5.11 MIL/uL   Hemoglobin 15.5 (H) 12.0 - 15.0 g/dL   HCT 91.6 (H) 38.4 - 66.5 %   MCV 97.8 80.0 - 100.0 fL    MCH 31.0 26.0 - 34.0 pg   MCHC 31.7 30.0 - 36.0 g/dL   RDW 99.3 57.0 - 17.7 %   Platelets 235 150 - 400 K/uL   nRBC 0.0 0.0 - 0.2 %    Comment: Performed at [facility], [address], [place], [place] [address]  Differential     Status: Abnormal   Collection Time: 10/15/2019 11:19 PM  Result Value Ref Range   Neutrophils Relative % 48 %   Neutro Abs 5.5 1.7 - 7.7 K/uL   Lymphocytes Relative 40 %   Lymphs Abs 4.7 (H) 0.7 - 4.0 K/uL   Monocytes Relative 8 %   Monocytes Absolute 1.0 0.1 - 1.0 K/uL   Eosinophils Relative 3 %   Eosinophils Absolute 0.4 0.0 - 0.5 K/uL   Basophils Relative 1 %   Basophils Absolute 0.1 0.0 - 0.1 K/uL   Immature Granulocytes 0 %   Abs Immature Granulocytes 0.02 0.00 - 0.07 K/uL    Comment: Performed at [facility], [address] [place], [place] [address]  Comprehensive metabolic panel     Status: Abnormal   Collection Time: 10/30/2019 11:19 PM  Result Value Ref Range   Sodium 139 135 - 145 mmol/L   Potassium 4.4 3.5 - 5.1 mmol/L   Chloride 106 98 - 111 mmol/L   CO2  21 (L) 22 - 32 mmol/L   Glucose, Bld 104 (H) 70 - 99 mg/dL    Comment: Glucose reference range applies only to samples taken after fasting for at least 8 hours.   BUN 21 (H) 6 - 20 mg/dL   Creatinine, Ser 9.62 0.44 - 1.00 mg/dL   Calcium 9.2 8.9 - 83.6 mg/dL   Total Protein 7.5 6.5 - 8.1 g/dL   Albumin 3.8 3.5 - 5.0 g/dL   AST 25 15 - 41 U/L   ALT 22 0 - 44 U/L   Alkaline Phosphatase 82 38 - 126 U/L   Total Bilirubin 0.6 0.3 - 1.2 mg/dL   GFR calc non Af Amer >60 >60 mL/min   GFR calc Af Amer >60 >60 mL/min   Anion gap 12 5 - 15    Comment: Performed at [facility], [address] [place], [place] [address]  I-stat chem 8, ED     Status: Abnormal   Collection Time: 10/21/2019 11:26 PM  Result Value Ref Range   Sodium 137 135 - 145 mmol/L   Potassium 3.8 3.5 - 5.1 mmol/L   Chloride 105 98 - 111 mmol/L   BUN 24 (H) 6 - 20 mg/dL   Creatinine, Ser 6.54 0.44 - 1.00  mg/dL   Glucose, Bld 650 (H) 70 - 99 mg/dL    Comment: Glucose reference range applies only to samples taken after fasting for at least 8 hours.   Calcium, Ion 0.92 (L) 1.15 - 1.40 mmol/L   TCO2 25 22 - 32 mmol/L   Hemoglobin 16.3 (H) 12.0 - 15.0 g/dL   HCT 35.4 (H) 65.6 - 81.2 %  I-Stat beta hCG blood, ED     Status: None   Collection Time: 10/02/2019 11:27 PM  Result Value Ref Range   I-stat hCG, quantitative <5.0 <5 mIU/mL   Comment 3            Comment:   GEST. AGE      CONC.  (mIU/mL)   <=1 WEEK        5 - 50     2 WEEKS       50 - 500     3 WEEKS       100 - 10,000     4 WEEKS     1,000 - 30,000        FEMALE AND NON-PREGNANT FEMALE:     LESS THAN 5 mIU/mL     DG Chest Port 1 View  Result Date: 10/19/2019 CLINICAL DATA:  Altered mental status EXAM: PORTABLE CHEST 1 VIEW COMPARISON:  03/12/2016 FINDINGS: Endotracheal tube is 1.4 cm above the carina. NG tube is in the stomach. Bilateral airspace disease. Heart is upper limits normal in size. No effusions or acute bony abnormality. IMPRESSION: Bilateral airspace disease could reflect edema or infection. Electronically Signed   By: Charlett Nose M.D.   On: 10/19/2019 00:06   CT HEAD CODE STROKE WO CONTRAST  Result Date: 10/06/2019 CLINICAL DATA:  Code stroke.  Left facial droop EXAM: CT HEAD WITHOUT CONTRAST TECHNIQUE: Contiguous axial images were obtained from the base of the skull through the vertex without intravenous contrast. COMPARISON:  Head CT 03/24/2015 FINDINGS: Examination degraded by motion. Brain: There is a large intraparenchymal hematoma centered in the left basal ganglia measuring 5.0 x 3.0 x 4.1 cm (volume = 32 cm^3). There is subarachnoid extension into the lateral ventricles and inferiorly into the fourth ventricle. There is entrapment of  the right lateral ventricle with dilatation of atrium and temporal horn. There is rightward midline shift at the level of the foramina of [place] that measures 6 mm. Vascular: No abnormal  hyperdensity of the major intracranial arteries or dural venous sinuses. No intracranial atherosclerosis. Skull: The visualized skull base, calvarium and extracranial soft tissues are normal. Sinuses/Orbits: No fluid levels or advanced mucosal thickening of the visualized paranasal sinuses. No mastoid or middle ear effusion. The orbits are normal. IMPRESSION: 1. 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 2. Right lateral ventricle entrapment with dilatation of the atrium and temporal horn. *Dr. Kerney Elbe paged at 11:34 p.m. on 10-19-2019. Electronically Signed   By: Ulyses Jarred M.D.   On: 2019/10/19 23:34    Review of Systems  Unable to perform ROS: Acuity of condition   Blood pressure (!) 195/128, pulse 90, temperature 98.9 F (37.2 C), temperature source Oral, resp. rate 18, weight 103.8 kg, SpO2 100 %. Physical Exam  Neurological: She is unresponsive.  Patient is status post paralytics and sedatives for intubation pupils are pinpoint no movement to noxious stimulation    Assessment/Plan: 49 year old female with large deep dominant hemisphere basal ganglia hemorrhage minimal minimal exam partially clouded by medications for intubation.  However prior to those medications patient was not seen to be responsive in the ER.  Extensive intracerebral hemorrhage extending throughout the basal ganglia with intraventricular extension and brainstem compression dominant hemisphere.  I do not think that surgical evacuation is indicated patient is a very poor prognosis and chances of functional survival is minimal.  External ventricular drain placement would only be possible on the right side and this would not reverse the natural course and probably increased left to right midline shift.  I have talked to patient's family will continue to counsel with him and discussed with neurology.  Bowdy Bair P 10/19/2019, 12:26 AM

## 2019-10-19 NOTE — Progress Notes (Addendum)
Upon assessment of pt at the beginning of shift pt's BP was found to be above SBP goal of 100-140. Pt was currently max out on cleviprex gtt. Pt was given PRN dose of 5mg  IV labetalol with little to no change in SBP. Dr was notified. Labetalol gtt was ordered. Not loaded in pyxis so there was a little delay in getting medication started since it was coming from pharmacy. Once medication arrived labetalol gtt was started at 0.5 mg. Currently titrating gtt per order.

## 2019-10-19 NOTE — Progress Notes (Signed)
Chaplain responded to page to ED to attend sister of patient.  Momentarily three more family members arrived.  All family members were escorted to Consult A where provider met with family.  Chaplain offered nourishment and prayer.  Invited family to have Chaplain paged if needed.  De Burrs Chaplain Resident

## 2019-10-19 NOTE — Progress Notes (Signed)
NAME:  Debra Huff, MRN:  [id number], DOB:  [birth year], LOS: 0 ADMISSION DATE:  10/13/2019, CONSULTATION DATE:  10/19/19 REFERRING MD:  Otelia Limes, CHIEF COMPLAINT:  Hemorrhagic CVA   Brief History   49 y.o. F with uncontrolled HTN who presented from home with AMS and R sided facial droop.  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  Pt was intubated and PCCM consulted for vent management.  History of present illness   Debra Huff is a 49 y.o. F with HTN for which she has not sought medical care or been taking medications for years per her sister.  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   Seen by neurology and neurosurgery and deemed not a surgical candidate.  Intubated and PCCM consulted for vent management.   Extremely hypertensive 269/136 and started on Cleviprex gtt.  Past Medical History  HTN  Significant Hospital Events   4/18 Admit to ICU, neurology primary  Consults:  Neurosurgery  PCCM  Procedures:  4/19 ETT 4/19 R IJ CVC  Significant Diagnostic Tests:  10/19/19 CT head>> Large intraparenchymal hematoma centered in the left basal ganglia with subarachnoid extension and 6 mm rightward midlinesShift. Right lateral ventricle entrapment with dilatation of the atrium and temporal horn.  Micro Data:  10/19/19 Sars-Cov-2>>negative   Antimicrobials:    Interim history/subjective:  No acute events.  Vitals stable.  Objective   Blood pressure (!) 148/73, pulse 83, temperature 98.8 F (37.1 C), temperature source Axillary, resp. rate (!) 22, height 5\' 5"  (1.651 m), weight 103.8 kg, SpO2 100 %.    Vent Mode: PRVC FiO2 (%):  [40 %-100 %] 40 % Set Rate:  [18 bmp] 18 bmp Vt Set:  [450 mL-500 mL] 450 mL PEEP:  [5 cmH20] 5 cmH20 Plateau Pressure:  [20 cmH20-24 cmH20] 20 cmH20  No intake or output data in the 24 hours  ending 10/19/19 0803 Filed Weights   10/13/2019 2321  Weight: 103.8 kg    Physical Exam: General: Adult female, critically ill. Neuro: Unresponsive despite no sedation.10/20/19 HEENT: [place]/AT. Sclerae anicteric. ETT in place. Cardiovascular: RRR, no M/R/G.  Lungs: Respirations even and unlabored.  CTA bilaterally, No W/R/R. Abdomen: BS x 4, soft, NT/ND.  Musculoskeletal: No gross deformities, no edema.  Skin: Intact, warm, some mottling of lower extremities.   Assessment & Plan:   Basal ganglia ICH - Not on AC at home.  Evaluated by neurosurgery and deemed to not be a surgical candidate. - Cleviprex to maintain SBP between 100-140 per neuro. - Labetalol PRN. - Continue hypertonic saline with frequent Na checks, goal Na < 160. - Goals of care per neuro.  Respiratory failure - Pateint unable to protect airway and intubated in ED. - Continue full vent support. - No weaning due to mental status. - Follow CXR.  Mottling of lower extremities - unclear reasons at this point. - Assess lactate for completeness.  Rest per primary team.  Best practice:  Diet: NPO. Pain/Anxiety/Delirium protocol (if indicated): propofol gtt as needed (currently off). VAP protocol (if indicated): HOB at 30 degrees. DVT prophylaxis: No DVT prophylaxis indicated given ICH GI prophylaxis: Protonix. Glucose control: SSI if glucose consistently > 180. Mobility: NA. Code Status: Full code. Family Communication: None available this AM.  Goals of care discussions with family per neuro given poor prognosis. Disposition: ICU for now.  Critical care time:  35 min.    Montey Hora, [facility] 10/19/2019, 8:18 AM

## 2019-10-19 NOTE — ED Notes (Signed)
New bottle hung of cleviprex

## 2019-10-19 NOTE — ED Notes (Addendum)
CCM at bedside to place central line 

## 2019-10-19 NOTE — Progress Notes (Signed)
Patient ID: Debra Huff, female   DOB: 06/13/1971, 48 y.o.   MRN: [id number] Nonpurposeful and posturing movements all extremities pupils remain pinpoint no significant neuro change  CT possible slight increase in hemorrhage then stable  Continue supportive care for now

## 2019-10-19 NOTE — Progress Notes (Signed)
PT Cancellation Note  Patient Details Name: Debra Huff MRN: [id number] DOB: [birth year]   Cancelled Treatment:    Reason Eval/Treat Not Completed: Patient not medically ready. Jasmine December, NP deferred today due to pts BP high and prognosis not good. Awaiting N/S decision on EVD as well. Acute PT to return as able, as appropriate to complete PT eval.  Lewis Shock, PT, DPT Acute Rehabilitation Services Pager #: [phone] Office #: [phone]    Iona Hansen 10/19/2019, 10:08 AM

## 2019-10-19 NOTE — Progress Notes (Signed)
eLink Physician-Brief Progress Note Patient Name: Debra Huff DOB: 1971-05-24 MRN: [id number]   Date of Service  10/19/2019  HPI/Events of Note  one time labetalol worked well earlier for SBP>140. But now 146/81. Held er about 2 hours. Can we get PRN dose??   eICU Interventions  Labetalol 5 mg q2 hr x 4 doses ordered for SBP > 140     Intervention Category Intermediate Interventions: Hypertension - evaluation and management  Ranee Gosselin 10/19/2019, 6:40 AM

## 2019-10-19 NOTE — Progress Notes (Signed)
STROKE TEAM PROGRESS NOTE   INTERVAL HISTORY RN at bedside.  Patient family just left before I arrive.  Patient still intubated, off sedation.  Not responsive.  Neurosurgery on board, no intervention at this time.  On 3% saline, sodium 143.  Vitals:   10/19/19 0800 10/19/19 0811 10/19/19 0815 10/19/19 0830  BP: (!) 148/73  (!) 150/78 (!) 150/71  Pulse: 83 86 84 87  Resp: (!) 22 (!) 23 (!) 23 (!) 24  Temp: 99.1 F (37.3 C)     TempSrc: Axillary     SpO2: 100% 100% 100% 100%  Weight:      Height:        CBC:  Recent Labs  Lab 10/03/2019 2319 10/17/2019 2326  WBC 11.6*  --   NEUTROABS 5.5  --   HGB 15.5* 16.3*  HCT 48.9* 48.0*  MCV 97.8  --   PLT 235  --     Basic Metabolic Panel:  Recent Labs  Lab 10/26/2019 2319 10/13/2019 2326  NA 139 137  K 4.4 3.8  CL 106 105  CO2 21*  --   GLUCOSE 104* 110*  BUN 21* 24*  CREATININE 0.98 1.00  CALCIUM 9.2  --    Lipid Panel: No results found for: CHOL, TRIG, HDL, CHOLHDL, VLDL, LDLCALC HgbA1c: No results found for: HGBA1C Urine Drug Screen:     Component Value Date/Time   LABOPIA NONE DETECTED 10/19/2019 0600   COCAINSCRNUR NONE DETECTED 10/19/2019 0600   LABBENZ NONE DETECTED 10/19/2019 0600   AMPHETMU POSITIVE (A) 10/19/2019 0600   THCU NONE DETECTED 10/19/2019 0600   LABBARB NONE DETECTED 10/19/2019 0600    Alcohol Level     Component Value Date/Time   ETH <10 10/19/2019 0057    IMAGING past 24 hours CT HEAD WO CONTRAST  Result Date: 10/19/2019 CLINICAL DATA:  49 year old female status post code stroke presentation on 10/03/2019 with acute hemorrhage into the left hemisphere and ventricles at that time. EXAM: CT HEAD WITHOUT CONTRAST TECHNIQUE: Contiguous axial images were obtained from the base of the skull through the vertex without intravenous contrast. COMPARISON:  10/20/2019 head CT. FINDINGS: Brain: Large, oval, intra-axial hyperdense hemorrhage centered at the left lentiform and corona radiata now encompasses 68 x  39 x 49 mm (AP by transverse by CC). Versus approximately 61 by 39 by 44 mm previously when measured in the same way ( about 65 mL estimated intra-axial blood volume today versus 52 mL by my calculation at presentation. Relatively large volume of intraventricular hemorrhage extension appears stable with mild to moderate ventriculomegaly. Mild transependymal edema. Stable rightward midline shift of 7 mm. No subdural or epidural blood identified. Stable basilar cisterns. Left hemisphere and transependymal edema but no superimposed acute cortically based ischemic infarct identified. Vascular: Calcified atherosclerosis at the skull base. Skull: Negative. Sinuses/Orbits: Subtotal opacification of the right tympanic cavity, with chronic sclerosis at the right mastoid antrum (series 4, image 23) and opacified other right mastoid air cells. But the left tympanic cavity, mastoids, and visible paranasal sinuses are well pneumatized. Other: No acute orbit or scalp soft tissue finding. There is fluid in the visible pharynx, and the patient appears to be intubated. IMPRESSION: 1. Mild extension of left basal ganglia intra-axial hemorrhage since yesterday, relatively large up to 6.8 cm long axis. 2. Stable relatively large volume of intraventricular extension. Stable ventriculomegaly and sub appended mole edema. 3. Stable left hemisphere edema and 7 mm of rightward midline shift. Basilar cisterns remain patent. 4. No new  intracranial abnormality. Electronically Signed   By: Genevie Ann M.D.   On: 10/19/2019 06:10   DG Chest Portable 1 View  Result Date: 10/19/2019 CLINICAL DATA:  Central line placement EXAM: PORTABLE CHEST 1 VIEW COMPARISON:  Radiograph 10/10/2019 FINDINGS: Endotracheal tube is low within the trachea, approximately 1.4 cm from the carina. Consider retraction 2 cm to the mid trachea. Transesophageal tube tip and side port distal to the GE junction, below the level of imaging. A right IJ approach central venous  catheter tip terminates at the superior cavoatrial junction. Telemetry leads overlie the chest. Bilateral mixed interstitial and hazy opacity is similar to prior. No pneumothorax or effusion. Cardiomediastinal contours are stable. No acute osseous or soft tissue abnormality. Degenerative changes are present in the imaged spine and shoulders. IMPRESSION: 1. Endotracheal tube low within the trachea, approximately 1.4 cm from the carina. Consider retraction 2 cm to the mid trachea. 2. Right IJ central venous catheter tip terminates at the superior cavoatrial junction. 3. Stable bilateral mixed interstitial and hazy opacity. Could reflect edema and/or infection. Electronically Signed   By: Lovena Le M.D.   On: 10/19/2019 01:31   DG Chest Port 1 View  Result Date: 10/19/2019 CLINICAL DATA:  Altered mental status EXAM: PORTABLE CHEST 1 VIEW COMPARISON:  03/12/2016 FINDINGS: Endotracheal tube is 1.4 cm above the carina. NG tube is in the stomach. Bilateral airspace disease. Heart is upper limits normal in size. No effusions or acute bony abnormality. IMPRESSION: Bilateral airspace disease could reflect edema or infection. Electronically Signed   By: Rolm Baptise M.D.   On: 10/19/2019 00:06   CT HEAD CODE STROKE WO CONTRAST  Result Date: 10/21/2019 CLINICAL DATA:  Code stroke.  Left facial droop EXAM: CT HEAD WITHOUT CONTRAST TECHNIQUE: Contiguous axial images were obtained from the base of the skull through the vertex without intravenous contrast. COMPARISON:  Head CT 03/24/2015 FINDINGS: Examination degraded by motion. Brain: There is a large intraparenchymal hematoma centered in the left basal ganglia measuring 5.0 x 3.0 x 4.1 cm (volume = 32 cm^3). There is subarachnoid extension into the lateral ventricles and inferiorly into the fourth ventricle. There is entrapment of the right lateral ventricle with dilatation of atrium and temporal horn. There is rightward midline shift at the level of the foramina of  [place] that measures 6 mm. Vascular: No abnormal hyperdensity of the major intracranial arteries or dural venous sinuses. No intracranial atherosclerosis. Skull: The visualized skull base, calvarium and extracranial soft tissues are normal. Sinuses/Orbits: No fluid levels or advanced mucosal thickening of the visualized paranasal sinuses. No mastoid or middle ear effusion. The orbits are normal. IMPRESSION: 1. 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 2. Right lateral ventricle entrapment with dilatation of the atrium and temporal horn. *Dr. Kerney Elbe paged at 11:34 p.m. on 10/26/2019. Electronically Signed   By: Ulyses Jarred M.D.   On: 10/09/2019 23:34    PHYSICAL EXAM  Temp:  [98.8 F (37.1 C)-99.1 F (37.3 C)] 99.1 F (37.3 C) (04/19 0800) Pulse Rate:  [71-94] 80 (04/19 1015) Resp:  [14-27] 24 (04/19 1015) BP: (122-269)/(61-193) 136/71 (04/19 1015) SpO2:  [87 %-100 %] 98 % (04/19 1015) FiO2 (%):  [40 %-100 %] 40 % (04/19 0811) Weight:  [103.8 kg] 103.8 kg (04/18 2321)  General - Well nourished, well developed, intubated off sedation.  Ophthalmologic - fundi not visualized due to noncooperation.  Cardiovascular - Regular rate and rhythm.  Neuro - intubated off propofol, eyes closed, not following commands. With forced eye opening, eyes in mid position, not blinking to visual threat, doll's eyes sluggish, not tracking, PERRL. Corneal reflex weak on the left, absent on the right, gag and cough present. Breathing  over the vent.  Facial symmetry not able to test due to ET tube.  Tongue protrusion not cooperative. On pain stimulation, withdraw to all extremities, left more than right. DTR 1+ and bilateral positive babinski. Sensation, coordination and gait not tested.   ASSESSMENT/PLAN Ms. MODESTY RUDY is a 49 y.o. female with uncontrolled HTN presenting with R sided weakness, R facial droop,  diaphoreses, leaning to the R and unresponsive. Found to be twitching all over w/ pinpoint pupils and SBP > 260.   Stroke:  R large basal ganglia ICH w/ IVH, SAH and associated cerebral edema w/ subfalcine herniation, hemorrhage secondary to hypertension   CT head large L basal ganglia ICH w/ SAH and 63mm R midline shift. R lateral ventricle entrapment w/ dilatation of atrium and temporal horn.   Repeat CT head mild extension large L basal ganglia ICH. Stable IVH. Stable ventriculomegaly. 60mm midline shift. No new abnormality.  CTA head & neck in am  2D Echo EF 60-65%  LDL pending   HgbA1c pending   SCDs for VTE prophylaxis  No antithrombotic prior to admission, now on No antithrombotic given hemorrhage   Therapy recommendations:  pending   Disposition:  pending   Acute Respiratory Failure d/t ICH  Intubated in ED  CCM on board   Off sedation  Wean off vent as able  Cerebral Edema  NSG consulted [name]) no role for surgical intervention on admission. Following for possible EVD.  CT head 6->82mm midline shift   Started on 3% @ 50/h  23.4% saline x 1  Goal Na 150-155  Na 139->137->141->143  Na Q6h  Hypertensive Emergency  Home meds:  None  SBP > 260 on arrival . SBP goal < 140  maxed out On IV Cleviprex w/o BP control  Added labetalol gtt . Long-term BP goal normotensive  Dysphagia . Secondary to stroke . NPO . Speech on board   Other Stroke Risk Factors  UDS:  Amphetamines POSITIVE - cessation education will be provided  Obesity, Body mass index is 38.08 kg/m., recommend weight loss, diet and exercise as appropriate   Other Active Problems  Leukocytosis WBC 11.6  Polycythemia Hb 15.5->16.3 - ?? smoker  Hospital day # 0  This patient is critically ill due to large ICH, IVH, SAH, ventriculomegaly, hypertensive emergency and at significant risk of neurological worsening, death form hematoma expansion, cerebral edema, brain herniation,  seizure, hydrocephalus, heart failure. This patient's care requires constant monitoring of vital signs, hemodynamics, respiratory and cardiac monitoring, review of multiple databases, neurological assessment, discussion with family, other specialists and medical decision making of high complexity. I spent 40 minutes of neurocritical care time in the care of this patient.  I have discussed with Dr. [name].  Marvel Plan, MD PhD Stroke Neurology 10/19/2019 3:07 PM  To contact Stroke Continuity provider, please refer to [email or web address]. After hours, contact General Neurology

## 2019-10-19 NOTE — Progress Notes (Signed)
eLink Physician-Brief Progress Note Patient Name: Debra Huff DOB: [birth year] MRN: [id number]   Date of Service  10/19/2019  HPI/Events of Note  Earlier accepted from Neurologist for below Vent care. Dr Kelby Fam notes, meds, data reviewed. 49 y.o. F with uncontrolled HTN who presented from home with AMS and R sided facial droop.  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  Pt was intubated and PCCM consulted for vent management. CTH: Large intraparenchymal hematoma centered in the left basal ganglia with subarachnoid extension and 6 mm rightward midlinesShift. Right lateral ventricle entrapment with dilatation of the atrium and temporal horn Camera: Discussed with bed side RN. SBP 154, HR 88, maxed out on cleviparex.  In synchrony with Vent. 500/11/17/58%. Height rougly 5'6 inch.      eICU Interventions  - Vt decreased to 500, increased rate to 20, follow ABG. - continue care. - SCD as VTE. - Labetalol 10 mg IV once to keep SBP < 140 goal. Avoid tachycardia. On propofol sedation to goal RASS -2 to -3.      Intervention Category Major Interventions: Hypertension - evaluation and management;Hemorrhage - evaluation and management;Respiratory failure - evaluation and management Evaluation Type: New Patient Evaluation  Ranee Gosselin 10/19/2019, 3:25 AM

## 2019-10-19 NOTE — Progress Notes (Addendum)
OT Cancellation Note  Patient Details Name: Debra Huff MRN: [id number] DOB: 1971/06/22   Cancelled Treatment:    Reason Eval/Treat Not Completed: Active bedrest order;Patient not medically ready(Intubated. Elevated BP. Awaiting N/S decision on EVD as well. Will hold and return as schedule allows.)  Jabir Dahlem M Quierra Silverio Rondell Pardon MSOT, OTR/L Acute Rehab Pager: [phone] Office: [phone] 10/19/2019, 10:09 AM

## 2019-10-19 NOTE — ED Notes (Signed)
CVC placement verified by Bebe Shaggy, MD. Cleviprex moved to [facility] [employer] .

## 2019-10-19 NOTE — Progress Notes (Signed)
*  PRELIMINARY RESULTS* Echocardiogram 2D Echocardiogram has been performed.  Debra Huff 10/19/2019, 1:36 PM

## 2019-10-19 NOTE — Code Documentation (Signed)
Responded to Code Stroke called at 2247 for R sided weakness and facial droop, [id number]. Pt arrived at 2309, CBG 94 , NIH-28 , CT Head-large intraparenchymal hematoma centered in the L basal ganglia with subarachnoid extension and 38mm rightward midline shift, R LAT ventricle entrapment with dilatation of the atrium and temporal horn. Pt SBP-260s>10mg  labetolol given at 2326. There was a concern for airway protection while in CT so pt taken back to ED room and intubated. Propofol gtt started after intubation. After several attempts to gain additional IV access, R tibia IO line placed by ED MD and cleviprex started. Neurosurgery to come evaluate pt.

## 2019-10-19 NOTE — Progress Notes (Signed)
Chaplain responded to call from Security regarding another family member arriving, who self-described as "the boyfriend."  Chaplain spoke with husband who declared that gentleman had to leave.  Husband established his rights as primary next-of-kin and not patient's half-sister, asking that her name be erased from patient's record.  Chaplain requested husband voice that request to patient's nurse.  Chaplain and Security escorted the non-related female out of the hospital at the request of the husband. Half-sister was disturbed and left as well.   This was a tense situation that was handled with collaboration between [place] and Security to avert an altercation.  The husband and son visited the patient and left quietly with her possessions.  The husband and son set a Password that only the immediate family would have when calling in for information about the patient.  Chaplain escorted family to ED exit.  Vernell Morgans Chaplain Resident

## 2019-10-19 NOTE — Procedures (Signed)
Central Venous Catheter Insertion Procedure Note Debra Huff [id number] August 03, 1970  Procedure: Insertion of Central Venous Catheter Indications: Assessment of intravascular volume, Drug and/or fluid administration and Frequent blood sampling  Procedure Details Consent: Risks of procedure as well as the alternatives and risks of each were explained to the (patient/caregiver).  Consent for procedure obtained. Time Out: Verified patient identification, verified procedure, site/side was marked, verified correct patient position, special equipment/implants available, medications/allergies/relevent history reviewed, required imaging and test results available.  Performed  Maximum sterile technique was used including antiseptics, cap, gloves, gown, hand hygiene, mask and sheet. Skin prep: Chlorhexidine; local anesthetic administered A antimicrobial bonded/coated triple lumen catheter was placed in the right internal jugular vein using the Seldinger technique and verified with [place].  Evaluation Blood flow good Complications: No apparent complications Patient did tolerate procedure well. Chest X-ray ordered to verify placement.  CXR: pending.  Debra Huff 10/19/2019, 1:09 AM

## 2019-10-19 NOTE — Progress Notes (Signed)
SLP Cancellation Note  Patient Details Name: Debra Huff MRN: [id number] DOB: 1970-08-03   Cancelled treatment:       Reason Eval/Treat Not Completed: Patient not medically ready   Mavi Un, Riley Nearing 10/19/2019, 7:51 AM

## 2019-10-19 NOTE — Progress Notes (Signed)
Patient transported from ED Room 18 to 4N24 with no complications.

## 2019-10-19 NOTE — ED Notes (Signed)
Positive gag reflex.

## 2019-10-20 ENCOUNTER — Inpatient Hospital Stay (HOSPITAL_COMMUNITY): Payer: Self-pay

## 2019-10-20 DIAGNOSIS — I610 Nontraumatic intracerebral hemorrhage in hemisphere, subcortical: Secondary | ICD-10-CM

## 2019-10-20 DIAGNOSIS — E876 Hypokalemia: Secondary | ICD-10-CM

## 2019-10-20 DIAGNOSIS — G936 Cerebral edema: Secondary | ICD-10-CM

## 2019-10-20 DIAGNOSIS — R1312 Dysphagia, oropharyngeal phase: Secondary | ICD-10-CM

## 2019-10-20 LAB — GLUCOSE, CAPILLARY
Glucose-Capillary: 111 mg/dL — ABNORMAL HIGH (ref 70–99)
Glucose-Capillary: 117 mg/dL — ABNORMAL HIGH (ref 70–99)
Glucose-Capillary: 130 mg/dL — ABNORMAL HIGH (ref 70–99)
Glucose-Capillary: 135 mg/dL — ABNORMAL HIGH (ref 70–99)
Glucose-Capillary: 142 mg/dL — ABNORMAL HIGH (ref 70–99)
Glucose-Capillary: 151 mg/dL — ABNORMAL HIGH (ref 70–99)

## 2019-10-20 LAB — LIPID PANEL
Cholesterol: 152 mg/dL (ref 0–200)
HDL: 46 mg/dL (ref >40)
LDL Cholesterol: 84 mg/dL (ref 0–99)
Total CHOL/HDL Ratio: 3.3 RATIO
Triglycerides: 108 mg/dL (ref <150)
VLDL: 22 mg/dL (ref 0–40)

## 2019-10-20 LAB — BASIC METABOLIC PANEL
Anion gap: 6 (ref 5–15)
BUN: 18 mg/dL (ref 6–20)
CO2: 21 mmol/L — ABNORMAL LOW (ref 22–32)
Calcium: 8.4 mg/dL — ABNORMAL LOW (ref 8.9–10.3)
Chloride: 121 mmol/L — ABNORMAL HIGH (ref 98–111)
Creatinine, Ser: 1.03 mg/dL — ABNORMAL HIGH (ref 0.44–1.00)
GFR calc Af Amer: >60 mL/min (ref >60)
GFR calc non Af Amer: >60 mL/min (ref >60)
Glucose, Bld: 159 mg/dL — ABNORMAL HIGH (ref 70–99)
Potassium: 3.4 mmol/L — ABNORMAL LOW (ref 3.5–5.1)
Sodium: 148 mmol/L — ABNORMAL HIGH (ref 135–145)

## 2019-10-20 LAB — PHOSPHORUS
Phosphorus: 2.2 mg/dL — ABNORMAL LOW (ref 2.5–4.6)
Phosphorus: 1.9 mg/dL — ABNORMAL LOW (ref 2.5–4.6)
Phosphorus: 2.5 mg/dL (ref 2.5–4.6)

## 2019-10-20 LAB — HEMOGLOBIN A1C
Hgb A1c MFr Bld: 5.7 % — ABNORMAL HIGH (ref 4.8–5.6)
Mean Plasma Glucose: 116.89 mg/dL

## 2019-10-20 LAB — MAGNESIUM
Magnesium: 2.1 mg/dL (ref 1.7–2.4)
Magnesium: 1.9 mg/dL (ref 1.7–2.4)
Magnesium: 2 mg/dL (ref 1.7–2.4)

## 2019-10-20 LAB — SODIUM
Sodium: 149 mmol/L — ABNORMAL HIGH (ref 135–145)
Sodium: 153 mmol/L — ABNORMAL HIGH (ref 135–145)
Sodium: 157 mmol/L — ABNORMAL HIGH (ref 135–145)
Sodium: 156 mmol/L — ABNORMAL HIGH (ref 135–145)

## 2019-10-20 LAB — CBC
HCT: 42.3 % (ref 36.0–46.0)
Hemoglobin: 13.5 g/dL (ref 12.0–15.0)
MCH: 30.5 pg (ref 26.0–34.0)
MCHC: 31.9 g/dL (ref 30.0–36.0)
MCV: 95.7 fL (ref 80.0–100.0)
Platelets: 234 10*3/uL (ref 150–400)
RBC: 4.42 MIL/uL (ref 3.87–5.11)
RDW: 15.5 % (ref 11.5–15.5)
WBC: 17.9 10*3/uL — ABNORMAL HIGH (ref 4.0–10.5)
nRBC: 0 % (ref 0.0–0.2)

## 2019-10-20 LAB — TSH: TSH: 0.833 u[IU]/mL (ref 0.350–4.500)

## 2019-10-20 MED ORDER — AMLODIPINE BESYLATE 10 MG PO TABS: 10.0000 mg | ORAL_TABLET | Freq: Every day | ORAL | Status: DC

## 2019-10-20 MED ORDER — PRO-STAT SUGAR FREE PO LIQD
30.0000 mL | Freq: Two times a day (BID) | ORAL | Status: DC
  Administered 2019-10-20: 09:00:00 30 mL
  Filled 2019-10-20: qty 30

## 2019-10-20 MED ORDER — POTASSIUM CHLORIDE 20 MEQ/15ML (10%) PO SOLN
40.0000 meq | ORAL | Status: AC
  Administered 2019-10-20 (×2): 40 meq
  Filled 2019-10-20 (×2): qty 30

## 2019-10-20 MED ORDER — LABETALOL HCL 100 MG PO TABS: 200.0000 mg | ORAL_TABLET | Freq: Three times a day (TID) | ORAL | Status: DC

## 2019-10-20 MED ORDER — POTASSIUM PHOSPHATES 15 MMOLE/5ML IV SOLN
30.0000 mmol | Freq: Once | INTRAVENOUS | Status: AC
  Administered 2019-10-20: 10:00:00 30 mmol via INTRAVENOUS
  Filled 2019-10-20: qty 10

## 2019-10-20 MED ORDER — FENTANYL CITRATE (PF) 100 MCG/2ML IJ SOLN: 50.0000 ug | INTRAMUSCULAR | Status: DC | PRN

## 2019-10-20 MED ORDER — PRO-STAT SUGAR FREE PO LIQD
30.0000 mL | Freq: Three times a day (TID) | ORAL | Status: DC
  Administered 2019-10-20 – 2019-10-27 (×21): 30 mL
  Filled 2019-10-20 (×21): qty 30

## 2019-10-20 MED ORDER — ADULT MULTIVITAMIN W/MINERALS CH
1.0000 | ORAL_TABLET | Freq: Every day | ORAL | Status: DC
  Administered 2019-10-20 – 2019-10-27 (×8): 1
  Filled 2019-10-20 (×8): qty 1

## 2019-10-20 MED ORDER — IOHEXOL 350 MG/ML SOLN
100.0000 mL | Freq: Once | INTRAVENOUS | Status: AC | PRN
  Administered 2019-10-20: 80 mL via INTRAVENOUS

## 2019-10-20 MED ORDER — VITAL HIGH PROTEIN PO LIQD
1000.0000 mL | ORAL | Status: DC
  Administered 2019-10-20: 09:00:00 1000 mL

## 2019-10-20 MED ORDER — SODIUM CHLORIDE 0.9% FLUSH
10.0000 mL | Freq: Two times a day (BID) | INTRAVENOUS | Status: DC
  Administered 2019-10-20 – 2019-10-21 (×4): 10 mL
  Administered 2019-10-21: 06:00:00 20 mL
  Administered 2019-10-22 – 2019-10-25 (×7): 10 mL

## 2019-10-20 MED ORDER — SODIUM CHLORIDE 0.9% FLUSH: 10.0000 mL | INTRAVENOUS | Status: DC | PRN

## 2019-10-20 MED ORDER — FENTANYL CITRATE (PF) 100 MCG/2ML IJ SOLN
50.0000 ug | INTRAMUSCULAR | Status: DC | PRN
  Administered 2019-10-27 (×3): 100 ug via INTRAVENOUS
  Filled 2019-10-20 (×3): qty 2

## 2019-10-20 MED ORDER — LABETALOL HCL 5 MG/ML IV SOLN
5.0000 mg | INTRAVENOUS | Status: AC | PRN
  Administered 2019-10-21 – 2019-10-22 (×2): 5 mg via INTRAVENOUS
  Filled 2019-10-20 (×2): qty 4

## 2019-10-20 MED ORDER — VITAL HIGH PROTEIN PO LIQD
1000.0000 mL | ORAL | Status: DC
  Administered 2019-10-20 – 2019-10-26 (×5): 1000 mL

## 2019-10-20 MED ORDER — LABETALOL HCL 100 MG PO TABS
200.0000 mg | ORAL_TABLET | Freq: Three times a day (TID) | ORAL | Status: DC
  Administered 2019-10-20 – 2019-10-21 (×4): 200 mg
  Filled 2019-10-20 (×4): qty 2

## 2019-10-20 MED ORDER — AMLODIPINE BESYLATE 10 MG PO TABS
10.0000 mg | ORAL_TABLET | Freq: Every day | ORAL | Status: DC
  Administered 2019-10-20 – 2019-10-25 (×6): 10 mg
  Filled 2019-10-20 (×6): qty 1

## 2019-10-20 NOTE — Progress Notes (Signed)
Initial Nutrition Assessment  DOCUMENTATION CODES:   Obesity unspecified  INTERVENTION:   Vital High Protein @ 40 ml/hr (960 ml/day) via OG tube 30 ml Prostat TID MVI daily   Provides: 1260 kcal, 129 grams protein, and 802 ml free water.   TF regimen and propofol at current rate providing 2892 total kcal/day    NUTRITION DIAGNOSIS:   Inadequate oral intake related to inability to eat as evidenced by NPO status.  GOAL:   Provide needs based on ASPEN/SCCM guidelines  MONITOR:   TF tolerance  REASON FOR ASSESSMENT:   Consult, Ventilator Enteral/tube feeding initiation and management  ASSESSMENT:   Pt with PMH of untreated HTN who was admitted with deep basal ganglia ICH with extension and brainstem compression.    Pt discussed during ICU rounds and with RN.  Per chart review pt was not a surgical candidate per neurosurgery. Pt started on cleviprex and hypertonic saline.  Pt off all sedation. No family present. Per RN discussing goals of care.   Patient is currently intubated on ventilator support MV: 9.7 L/min Temp (24hrs), Avg:99.5 F (37.5 C), Min:98.7 F (37.1 C), Max:100.6 F (38.1 C)  Propofol: off  Cleviprex @ 34 ml/hr provides: 1632 kcal  Medications reviewed and include: 40 mEq KCl every 4 hours, senokot-s 30 mmol Kphos x 1 IV Hypertonic saline  Labs reviewed: Na 148 (H), K+ 3.4 (L) 16 F OG tube , tip within stomach   NUTRITION - FOCUSED PHYSICAL EXAM:    Most Recent Value  Orbital Region  No depletion  Upper Arm Region  No depletion  Thoracic and Lumbar Region  No depletion  Buccal Region  No depletion  Temple Region  No depletion  Clavicle Bone Region  No depletion  Clavicle and Acromion Bone Region  No depletion  Scapular Bone Region  No depletion  Dorsal Hand  No depletion  Patellar Region  No depletion  Anterior Thigh Region  No depletion  Posterior Calf Region  No depletion  Edema (RD Assessment)  None  Hair  Reviewed  Eyes  Unable  to assess  Mouth  Unable to assess  Skin  Reviewed  Nails  Reviewed       Diet Order:   Diet Order            Diet NPO time specified  Diet effective now              EDUCATION NEEDS:   No education needs have been identified at this time  Skin:  Skin Assessment: Reviewed RN Assessment  Last BM:  unknown  Height:   Ht Readings from Last 1 Encounters:  10/19/19 5\' 5"  (1.651 m)    Weight:   Wt Readings from Last 1 Encounters:  2019-10-23 103.8 kg    Ideal Body Weight:  56.8 kg  BMI:  Body mass index is 38.08 kg/m.  Estimated Nutritional Needs:   Kcal:  1200-1500  Protein:  113-142 grams  Fluid:  >1.5 L/day  10/20/19., RD, LDN, CNSC See AMiON for contact information

## 2019-10-20 NOTE — Progress Notes (Addendum)
NAME:  Debra Huff, MRN:  [id number], DOB:  17-Dec-1970, LOS: 1 ADMISSION DATE:  2019-10-31, CONSULTATION DATE:  10/20/19 REFERRING MD:  Cheral Marker, CHIEF COMPLAINT:  Hemorrhagic CVA   Brief History   49 y.o. F with uncontrolled HTN who presented from home with AMS and R sided facial droop.  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  Pt was intubated and PCCM consulted for vent management.  History of present illness   Debra Huff is a 49 y.o. F with HTN for which she has not sought medical care or been taking medications for years per her sister.  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   Seen by neurology and neurosurgery and deemed not a surgical candidate.  Intubated and PCCM consulted for vent management.   Extremely hypertensive 269/136 and started on Cleviprex gtt.  Past Medical History  HTN  Significant Hospital Events   4/18 Admit to ICU, neurology primary  Consults:  Neurosurgery  PCCM  Procedures:  4/19 ETT 4/19 R IJ CVC  Significant Diagnostic Tests:  10/20/19 CT head>> Large intraparenchymal hematoma centered in the left basal ganglia with subarachnoid extension and 6 mm rightward midlinesShift. Right lateral ventricle entrapment with dilatation of the atrium and temporal horn. CTA head 4/19 > stable basal ganglia and corona radiata hemorrhage.  Stable IVH, stable 52mm MLS.  Stable dilatation of right temporal tip.  Micro Data:  10/20/19 Sars-Cov-2>>negative   Antimicrobials:    Interim history/subjective:  No acute events.  ON cleviprex and labetalol now to maintain SBP < 140. Remains on 3%. Tolerating PSV wean on 5/5 most of the day 4/19.  Objective   Blood pressure 114/65, pulse 70, temperature 99.4 F (37.4 C), temperature source Axillary, resp. rate 19, height 5\' 5"  (1.651 m), weight 103.8 kg, SpO2 97  %.    Vent Mode: PRVC FiO2 (%):  [40 %] 40 % Set Rate:  [18 bmp] 18 bmp Vt Set:  [450 mL] 450 mL PEEP:  [5 cmH20] 5 cmH20 Pressure Support:  [5 cmH20] 5 cmH20 Plateau Pressure:  [12 cmH20-19 cmH20] 12 cmH20   Intake/Output Summary (Last 24 hours) at 10/20/2019 0802 Last data filed at 10/20/2019 0700 Gross per 24 hour  Intake 2766.01 ml  Output 1600 ml  Net 1166.01 ml   Filed Weights   Oct 31, 2019 2321  Weight: 103.8 kg    Physical Exam: General: Adult female, critically ill. Neuro: Unresponsive despite no sedation. Does have purposeful movement to noxious stimuli. HEENT: Debra Huff/AT. Sclerae anicteric. ETT in place. Cardiovascular: RRR, no M/R/G.  Lungs: Respirations even and unlabored.  CTA bilaterally, No W/R/R. Abdomen: BS x 4, soft, NT/ND.  Musculoskeletal: No gross deformities, no edema.  Skin: Intact, warm, some mottling of lower extremities; though improved from exam 4/19.   Assessment & Plan:   Basal ganglia ICH - Not on AC at home.  Evaluated by neurosurgery and deemed to not be a surgical candidate. - Cleviprex and labetalol to maintain SBP between 100-140 per neuro. - Labetalol PRN. - Continue hypertonic saline with frequent Na checks, goal Na 150 - 155. - Goals of care per neuro. - Neurosurgery following for possibility of EVD placement.  Respiratory failure - Pateint unable to protect airway and intubated in ED. - Continue full vent support with daily weaning as tolerated. - Mental status will likely be major barrier to  extubation. - Might need trach if family wishes for continued aggressive care. - Follow CXR.  Hypokalemia. Hypophosphatemia. - Kphos.  Rest per primary team.  Best practice:  Diet: NPO. Pain/Anxiety/Delirium protocol (if indicated): propofol gtt as needed (currently off). VAP protocol (if indicated): HOB at 30 degrees. DVT prophylaxis: No DVT prophylaxis indicated given ICH GI prophylaxis: Protonix. Glucose control: SSI if glucose  consistently > 180. Mobility: NA. Code Status: Full code. Family Communication: None available this AM.  Goals of care discussions with family per neuro given poor prognosis. Disposition: ICU for now.  Critical care time: 30 min.    Rutherford Guys, [place] [facility]on[facility] 10/20/2019, 8:02 AM

## 2019-10-20 NOTE — Progress Notes (Signed)
SLP Cancellation Note  Patient Details Name: Debra Huff MRN: [id number] DOB: [birth year]   Cancelled treatment:       Reason Eval/Treat Not Completed: Patient not medically ready. Will follow up as able.    Mahala Menghini., M.A. CCC-SLP Acute Rehabilitation Services Pager [phone] Office [phone]  10/20/2019, 7:59 AM

## 2019-10-20 NOTE — Progress Notes (Signed)
OT Cancellation Note  Patient Details Name: Debra Huff MRN: [id number] DOB: 02/25/1971   Cancelled Treatment:    Reason Eval/Treat Not Completed: Active bedrest order;Medical issues which prohibited therapy. Dr. Roda Shutters asked to cancel and he will re-consult if/when pt appropriate to participate.  Marcanthony Sleight M Gibril Mastro Gearldene Fiorenza MSOT, OTR/L Acute Rehab Pager: [phone] Office: [phone] 10/20/2019, 11:06 AM

## 2019-10-20 NOTE — Progress Notes (Signed)
Patient ID: Debra Huff, female   DOB: 08-07-1970, 49 y.o.   MRN: [id number] Patient remains intubated sedation being held  Nonpurposeful purposeful movements with stimulation bilaterally pupils 3-2  CT scan overall stable no increase in vent size continue observation recommend continued serial CTs.

## 2019-10-20 NOTE — Progress Notes (Signed)
PT Cancellation Note  Patient Details Name: Debra Huff MRN: [id number] DOB: Jul 02, 1971   Cancelled Treatment:    Reason Eval/Treat Not Completed: Patient not medically ready. Pt remains to be medically unstable. Dr. Roda Shutters asked to cancel and he will re-consult if/when pt appropriate to participate in PT eval.  Debra Huff, PT, DPT Acute Rehabilitation Services Pager #: [phone] Office #: [phone]    Iona Hansen 10/20/2019, 9:46 AM

## 2019-10-20 NOTE — Progress Notes (Signed)
Retaining urine. Bladder scan reveals > 1000 cc. Foley has been ordered.   Electronically signed: Dr. Caryl Pina

## 2019-10-20 NOTE — Progress Notes (Signed)
Pt transported from 4N24 to CT and back without complication. Pt respiratory status remained stable throughout transport. Pt suctioned both before leaving unit and upon return as well. RT will continue to monitor.

## 2019-10-20 NOTE — Progress Notes (Signed)
STROKE TEAM PROGRESS NOTE   INTERVAL HISTORY RN at bedside. Pt still intubated, off sedation, not following commands, but withdraw on the left UE and LE as well as right LE with pain stimulation. CT stable hematoma and ventriculomegaly.  CT abdomen neck low aneurysm or AVM.  Still on 3% saline.  Vitals:   10/20/19 0802 10/20/19 0815 10/20/19 0904 10/20/19 0945  BP:  122/63 (!) 119/58 116/62  Pulse: 71 71 74 71  Resp: (!) Temp:      TempSrc:      SpO2: 97% 98%  95%  Weight:      Height:        CBC:  Recent Labs  Lab 10/06/2019 2319 10/01/2019 2319 10/10/2019 2326 10/20/19 0245  WBC 11.6*  --   --  17.9*  NEUTROABS 5.5  --   --   --   HGB 15.5*   < > 16.3* 13.5  HCT 48.9*   < > 48.0* 42.3  MCV 97.8  --   --  95.7  PLT 235  --   --  234   < > = values in this interval not displayed.    Basic Metabolic Panel:  Recent Labs  Lab 10/20/2019 2319 10/28/2019 2319 10/20/2019 2326 10/19/19 0913 10/19/19 2040 10/20/19 0245  NA 139   < > 137   < > 147* 148*  149*  K 4.4   < > 3.8  --   --  3.4*  CL 106   < > 105  --   --  121*  CO2 21*  --   --   --   --  21*  GLUCOSE 104*   < > 110*  --   --  159*  BUN 21*   < > 24*  --   --  18  CREATININE 0.98   < > 1.00  --   --  1.03*  CALCIUM 9.2  --   --   --   --  8.4*  MG  --   --   --   --   --  2.1  PHOS  --   --   --   --   --  2.2*   < > = values in this interval not displayed.   Lipid Panel:     Component Value Date/Time   CHOL 152 10/20/2019 0245   TRIG 108 10/20/2019 0245   HDL 46 10/20/2019 0245   CHOLHDL 3.3 10/20/2019 0245   VLDL 22 10/20/2019 0245   LDLCALC 84 10/20/2019 0245   HgbA1c:  Lab Results  Component Value Date   HGBA1C 5.7 (H) 10/20/2019   Urine Drug Screen:     Component Value Date/Time   LABOPIA NONE DETECTED 10/19/2019 0600   COCAINSCRNUR NONE DETECTED 10/19/2019 0600   LABBENZ NONE DETECTED 10/19/2019 0600   AMPHETMU POSITIVE (A) 10/19/2019 0600   THCU NONE DETECTED 10/19/2019 0600    LABBARB NONE DETECTED 10/19/2019 0600    Alcohol Level     Component Value Date/Time   ETH <10 10/19/2019 0057    IMAGING past 24 hours CT ANGIO HEAD W OR WO CONTRAST  Result Date: 10/20/2019 CLINICAL DATA:  Left corona radiata and basal ganglia parenchymal hemorrhage with extension into the ventricles. EXAM: CT ANGIOGRAPHY HEAD AND NECK TECHNIQUE: Multidetector CT imaging of the head and neck was performed using the standard protocol during bolus administration of intravenous contrast. Multiplanar CT image reconstructions and MIPs were  obtained to evaluate the vascular anatomy. Carotid stenosis measurements (when applicable) are obtained utilizing NASCET criteria, using the distal internal carotid diameter as the denominator. CONTRAST:  5mL OMNIPAQUE IOHEXOL 350 MG/ML SOLN COMPARISON:  CT head without contrast 10/19/19 and 11-11-2019. FINDINGS: CT HEAD FINDINGS Brain: Left basal ganglia and corona radiata hemorrhage is stable to slightly smaller, measuring 3.6 x 6.5 x 3.3 cm. Intraventricular extension and posterior layering is stable. Dilated right temporal tip is stable. 7 mm midline shift is stable. Surrounding edema is noted. Sulci are effaced. Blood is present in the fourth ventricle and aqueduct. Vascular: Atherosclerotic calcifications are present within the cavernous internal carotid arteries bilaterally. Skull: Calvarium is intact. No focal lytic or blastic lesions are present. No significant extracranial soft tissue lesion is present. Sinuses: The paranasal sinuses and mastoid air cells are clear. Orbits: The globes and orbits are within normal limits. Review of the MIP images confirms the above findings CTA NECK FINDINGS Aortic arch: A 3 vessel arch configuration is present. Atherosclerotic changes are noted at the arch without significant stenosis. Right carotid system: The right common carotid artery is within normal limits. Bifurcation is unremarkable. Cervical right ICA is normal. Left  carotid system: The left common carotid artery is within normal limits. Mild atherosclerotic changes are noted at the bifurcation. Cervical left ICA is normal. Vertebral arteries: The vertebral arteries are codominant. Both vertebral arteries originate from the subclavian arteries without significant stenosis. No significant stenosis is present in either vertebral artery in the neck. Skeleton: Vertebral body heights are maintained. No significant listhesis is present. Straightening of the normal cervical lordosis is noted. The patient is edentulous. Other neck: Patient is intubated. OG tube is in place. Right IJ line is normal. Soft tissues are otherwise unremarkable. Upper chest: A small right pleural effusion is present. Emphysematous changes are noted. Upper lung fields are otherwise clear. Review of the MIP images confirms the above findings CTA HEAD FINDINGS Anterior circulation: Atherosclerotic changes are present within the cavernous internal carotid arteries bilaterally. No significant stenosis is present. The A1 and M1 segments are normal. The anterior communicating artery is patent. MCA bifurcations are intact. The ACA and MCA branch vessels are within normal limits. Posterior circulation: The vertebral arteries are codominant. PICA origins are visualized and normal. The vertebrobasilar junction is normal. Basilar artery is normal. Both posterior cerebral arteries originate from basilar tip. PCA branch vessels are within normal limits bilaterally. Venous sinuses: The dural sinuses are patent. The straight sinus is and deep cerebral veins are intact. Cortical veins are unremarkable. No vascular malformation is evident. Anatomic variants: None Review of the MIP images confirms the above findings IMPRESSION: 1. Stable to slightly smaller left basal ganglia and corona radiata hemorrhage. 2. Stable intraventricular extension of hemorrhage. 3. Stable 7 mm midline shift. 4. Stable dilatation of the right temporal  tip. 5. Normal CTA circle-of-Willis without significant proximal stenosis, aneurysm, or branch vessel occlusion. No acute or focal lesion to explain the hemorrhage. 6. Atherosclerotic changes at the left carotid bifurcation and cavernous internal carotid arteries bilaterally without significant stenosis. Electronically Signed   By: Marin Roberts M.D.   On: 10/20/2019 07:36   CT ANGIO NECK W OR WO CONTRAST  Result Date: 10/20/2019 CLINICAL DATA:  Left corona radiata and basal ganglia parenchymal hemorrhage with extension into the ventricles. EXAM: CT ANGIOGRAPHY HEAD AND NECK TECHNIQUE: Multidetector CT imaging of the head and neck was performed using the standard protocol during bolus administration of intravenous contrast. Multiplanar CT image  reconstructions and MIPs were obtained to evaluate the vascular anatomy. Carotid stenosis measurements (when applicable) are obtained utilizing NASCET criteria, using the distal internal carotid diameter as the denominator. CONTRAST:  81mL OMNIPAQUE IOHEXOL 350 MG/ML SOLN COMPARISON:  CT head without contrast 10/19/19 and 11/10/2019. FINDINGS: CT HEAD FINDINGS Brain: Left basal ganglia and corona radiata hemorrhage is stable to slightly smaller, measuring 3.6 x 6.5 x 3.3 cm. Intraventricular extension and posterior layering is stable. Dilated right temporal tip is stable. 7 mm midline shift is stable. Surrounding edema is noted. Sulci are effaced. Blood is present in the fourth ventricle and aqueduct. Vascular: Atherosclerotic calcifications are present within the cavernous internal carotid arteries bilaterally. Skull: Calvarium is intact. No focal lytic or blastic lesions are present. No significant extracranial soft tissue lesion is present. Sinuses: The paranasal sinuses and mastoid air cells are clear. Orbits: The globes and orbits are within normal limits. Review of the MIP images confirms the above findings CTA NECK FINDINGS Aortic arch: A 3 vessel arch  configuration is present. Atherosclerotic changes are noted at the arch without significant stenosis. Right carotid system: The right common carotid artery is within normal limits. Bifurcation is unremarkable. Cervical right ICA is normal. Left carotid system: The left common carotid artery is within normal limits. Mild atherosclerotic changes are noted at the bifurcation. Cervical left ICA is normal. Vertebral arteries: The vertebral arteries are codominant. Both vertebral arteries originate from the subclavian arteries without significant stenosis. No significant stenosis is present in either vertebral artery in the neck. Skeleton: Vertebral body heights are maintained. No significant listhesis is present. Straightening of the normal cervical lordosis is noted. The patient is edentulous. Other neck: Patient is intubated. OG tube is in place. Right IJ line is normal. Soft tissues are otherwise unremarkable. Upper chest: A small right pleural effusion is present. Emphysematous changes are noted. Upper lung fields are otherwise clear. Review of the MIP images confirms the above findings CTA HEAD FINDINGS Anterior circulation: Atherosclerotic changes are present within the cavernous internal carotid arteries bilaterally. No significant stenosis is present. The A1 and M1 segments are normal. The anterior communicating artery is patent. MCA bifurcations are intact. The ACA and MCA branch vessels are within normal limits. Posterior circulation: The vertebral arteries are codominant. PICA origins are visualized and normal. The vertebrobasilar junction is normal. Basilar artery is normal. Both posterior cerebral arteries originate from basilar tip. PCA branch vessels are within normal limits bilaterally. Venous sinuses: The dural sinuses are patent. The straight sinus is and deep cerebral veins are intact. Cortical veins are unremarkable. No vascular malformation is evident. Anatomic variants: None Review of the MIP images  confirms the above findings IMPRESSION: 1. Stable to slightly smaller left basal ganglia and corona radiata hemorrhage. 2. Stable intraventricular extension of hemorrhage. 3. Stable 7 mm midline shift. 4. Stable dilatation of the right temporal tip. 5. Normal CTA circle-of-Willis without significant proximal stenosis, aneurysm, or branch vessel occlusion. No acute or focal lesion to explain the hemorrhage. 6. Atherosclerotic changes at the left carotid bifurcation and cavernous internal carotid arteries bilaterally without significant stenosis. Electronically Signed   By: Marin Roberts M.D.   On: 10/20/2019 07:36   DG Chest Port 1 View  Result Date: 10/20/2019 CLINICAL DATA:  Intercerebral hemorrhage. EXAM: PORTABLE CHEST 1 VIEW COMPARISON:  10/19/2019 FINDINGS: The endotracheal tube, NG tube and right IJ catheters are stable. The cardiac silhouette, mediastinal and hilar contours are within normal limits and stable. Much improved lung aeration with resolving  edema and atelectasis. IMPRESSION: 1. Stable support apparatus. 2. Much improved lung aeration with resolving edema and atelectasis. Electronically Signed   By: Rudie Meyer M.D.   On: 10/20/2019 08:18   ECHOCARDIOGRAM COMPLETE  Result Date: 10/19/2019    ECHOCARDIOGRAM REPORT   Patient Name:   Debra Huff Date of Exam: 10/19/2019 Medical Rec #:  [id number]     Height:       65.0 in Accession #:    [id number]    Weight:       228.8 lb Date of Birth:  12/01/1970     BSA:          2.095 m Patient Age:    48 years      BP:           130/65 mmHg Patient Gender: F             HR:           86 bpm. Exam Location:  Inpatient Procedure: 2D Echo Indications:    Stroke 434.91 / I163.9  History:        Patient has no prior history of Echocardiogram examinations.                 Blood pressure elevated without history of HTN, Intracerebral                 hemorrhage, Respiratory failure , she has not sought medical                 care or been taking  medications for years per her sister.  Sonographer:    Jeryl Columbia Referring Phys: [id number] Anddy Wingert IMPRESSIONS  1. Left ventricular ejection fraction, by estimation, is 60 to 65%. The left ventricle has normal function. The left ventricle has no regional wall motion abnormalities. Left ventricular diastolic parameters are consistent with Grade I diastolic dysfunction (impaired relaxation). Elevated left atrial pressure.  2. Right ventricular systolic function is normal. The right ventricular size is normal.  3. Thickened anterior chordae. The mitral valve is normal in structure. No evidence of mitral valve regurgitation. No evidence of mitral stenosis.  4. The aortic valve is normal in structure. Aortic valve regurgitation is not visualized. Mild aortic valve sclerosis is present, with no evidence of aortic valve stenosis.  5. The inferior vena cava is normal in size with greater than 50% respiratory variability, suggesting right atrial pressure of 3 mmHg. Conclusion(s)/Recommendation(s): No intracardiac source of embolism detected on this transthoracic study. A transesophageal echocardiogram is recommended to exclude cardiac source of embolism if clinically indicated. FINDINGS  Left Ventricle: Left ventricular ejection fraction, by estimation, is 60 to 65%. The left ventricle has normal function. The left ventricle has no regional wall motion abnormalities. The left ventricular internal cavity size was normal in size. There is  no left ventricular hypertrophy. Left ventricular diastolic parameters are consistent with Grade I diastolic dysfunction (impaired relaxation). Elevated left atrial pressure. Right Ventricle: The right ventricular size is normal. No increase in right ventricular wall thickness. Right ventricular systolic function is normal. Left Atrium: Left atrial size was normal in size. Right Atrium: Right atrial size was normal in size. Pericardium: There is no evidence of pericardial effusion.  Mitral Valve: Thickened anterior chordae. The mitral valve is normal in structure. Normal mobility of the mitral valve leaflets. No evidence of mitral valve regurgitation. No evidence of mitral valve stenosis. Tricuspid Valve: The tricuspid valve is normal in structure. Tricuspid valve regurgitation  is not demonstrated. No evidence of tricuspid stenosis. Aortic Valve: The aortic valve is normal in structure. Aortic valve regurgitation is not visualized. Mild aortic valve sclerosis is present, with no evidence of aortic valve stenosis. Pulmonic Valve: The pulmonic valve was normal in structure. Pulmonic valve regurgitation is not visualized. No evidence of pulmonic stenosis. Aorta: The aortic root is normal in size and structure. Venous: The inferior vena cava is normal in size with greater than 50% respiratory variability, suggesting right atrial pressure of 3 mmHg. IAS/Shunts: No atrial level shunt detected by color flow Doppler.  LEFT VENTRICLE PLAX 2D LVIDd:         5.60 cm  Diastology LVIDs:         3.40 cm  LV e' lateral:   5.66 cm/s LV PW:         1.10 cm  LV E/e' lateral: 17.1 LV IVS:        1.30 cm  LV e' medial:    5.44 cm/s LVOT diam:     2.10 cm  LV E/e' medial:  17.8 LVOT Area:     3.46 cm  RIGHT VENTRICLE RV S prime:     14.90 cm/s TAPSE (M-mode): 1.9 cm LEFT ATRIUM             Index       RIGHT ATRIUM           Index LA diam:        3.50 cm 1.67 cm/m  RA Area:     10.90 cm LA Vol (A2C):   45.4 ml 21.67 ml/m RA Volume:   21.80 ml  10.41 ml/m LA Vol (A4C):   48.1 ml 22.96 ml/m LA Biplane Vol: 47.5 ml 22.68 ml/m   AORTA Ao Root diam: 3.60 cm MITRAL VALVE MV Area (PHT): 3.17 cm     SHUNTS MV Decel Time: 239 msec     Systemic Diam: 2.10 cm MV E velocity: 97.00 cm/s MV A velocity: 113.00 cm/s MV E/A ratio:  0.86 Candee Furbish MD Electronically signed by Candee Furbish MD Signature Date/Time: 10/19/2019/2:31:16 PM    Final     PHYSICAL EXAM  Temp:  [98.7 F (37.1 C)-100.6 F (38.1 C)] 99.4 F (37.4  C) (04/20 0800) Pulse Rate:  [70-96] 71 (04/20 0945) Resp:  [17-30] 19 (04/20 0945) BP: (113-159)/(47-104) 116/62 (04/20 0945) SpO2:  [93 %-98 %] 95 % (04/20 0945) FiO2 (%):  [40 %] 40 % (04/20 0802)  General - Well nourished, well developed, intubated off sedation.  Ophthalmologic - fundi not visualized due to noncooperation.  Cardiovascular - Regular rate and rhythm.  Neuro - intubated off propofol, eyes closed, not following commands. With forced eye opening, eyes in mid position, not blinking to visual threat, doll's eyes sluggish, not tracking, PERRL. Corneal reflex weak on the left, absent on the right, gag and cough present. Breathing  over the vent.  Facial symmetry not able to test due to ET tube.  Tongue protrusion not cooperative. On pain stimulation, withdraw to LUE and LLE as well as RLE but not RUE. DTR 1+ and bilateral positive babinski. Sensation, coordination and gait not tested.   ASSESSMENT/PLAN Debra Huff is a 49 y.o. female with uncontrolled HTN presenting with R sided weakness, R facial droop, diaphoreses, leaning to the R and unresponsive. Found to be twitching all over w/ pinpoint pupils and SBP > 260.   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 R lateral ventricle entrapment w/ dilatation of atrium and temporal horn.   Repeat CT head 4/19 mild extension large L basal ganglia ICH. Stable IVH. Stable ventriculomegaly. 7mm midline shift. No new abnormality.  CT head 4/20 slightly smaller L basal ganglia and corona radiata ICH. Stable IVH. Stable 7mm midline shift. Stable dilation R temporal horn.    CTA head & neck No intracranial atherosclerosis. Atherosclerosis L ICA bifurcation and B cavernous ICAs.  2D Echo EF 60-65%  LDL 84  HgbA1c 5.7  SCDs for VTE prophylaxis  No antithrombotic  prior to admission, now on No antithrombotic given hemorrhage   Therapy recommendations:  reconsult when stable for evaluation  Disposition:  pending   Acute Respiratory Failure d/t ICH  Intubated in ED  CCM on board   Off sedation  Wean off vent if able   Likely will need trach for ongoing aggressive care  Cerebral Edema  NSG consulted [name](C[facility]) no role for surgical intervention on admission. Following for possible EVD.  CT head 4/19 6->517mm midline shift   CT head 4/20 stable 7mm midline shift   Started on 3% @ 50/h  23.4% saline x 1  Goal Na 150-155  Na 139->137->141->143->147->149->153  Na Q6h  Hypertensive Emergency  Home meds:  None  SBP > 260 on arrival . SBP goal < 160  On IV Cleviprex  off labetalol gtt  Added norvasc 10, labetalol 200 tid . Long-term BP goal normotensive  Hyperlipidemia  LDL 84, goal LDL < 70   on no statin PTA  Add statin on discharge  Dysphagia . Secondary to stroke . NPO . On tube feedings @ 40cc w/ prostat . Speech on board   Other Stroke Risk Factors  UDS:  Amphetamines POSITIVE - cessation education will be provided  Obesity, Body mass index is 38.08 kg/m., recommend weight loss, diet and exercise as appropriate   Other Active Problems  Leukocytosis WBC 11.6->17.9  Polycythemia Hb 15.5->16.3->13.5  - ?? Smoker - resolved  Hypokalemia 3.4 - supplemented  Hypophosphatemia 2.2  - supplemented  Urinary retention, foley placed  Hospital day # 1  This patient is critically ill due to large ICH, IVH, SAH, ventriculomegaly, hypertensive emergency and at significant risk of neurological worsening, death form hematoma expansion, cerebral edema, brain herniation, seizure, hydrocephalus, heart failure. This patient's care requires constant monitoring of vital signs, hemodynamics, respiratory and cardiac monitoring, review of multiple databases, neurological assessment, discussion with family, other specialists and  medical decision making of high complexity. I spent 40 minutes of neurocritical care time in the care of this patient. I had long discussion with husband Debra Huff over the phone, updated pt current condition, treatment plan and potential prognosis, and answered all the questions. He expressed understanding and appreciation. He will come tomorrow to meet me in person.   Marvel PlanJindong Zetha Kuhar, MD PhD Stroke Neurology 10/20/2019 10:02 AM  To contact Stroke Continuity provider, please refer to [email or web address]. After hours, contact General Neurology

## 2019-10-21 ENCOUNTER — Inpatient Hospital Stay (HOSPITAL_COMMUNITY): Payer: Self-pay

## 2019-10-21 DIAGNOSIS — J9600 Acute respiratory failure, unspecified whether with hypoxia or hypercapnia: Secondary | ICD-10-CM

## 2019-10-21 DIAGNOSIS — D72829 Elevated white blood cell count, unspecified: Secondary | ICD-10-CM

## 2019-10-21 LAB — BASIC METABOLIC PANEL
BUN: 19 mg/dL (ref 6–20)
CO2: 21 mmol/L — ABNORMAL LOW (ref 22–32)
Calcium: 8.6 mg/dL — ABNORMAL LOW (ref 8.9–10.3)
Chloride: >130 mmol/L (ref 98–111)
Creatinine, Ser: 0.91 mg/dL (ref 0.44–1.00)
GFR calc Af Amer: >60 mL/min (ref >60)
GFR calc non Af Amer: >60 mL/min (ref >60)
Glucose, Bld: 146 mg/dL — ABNORMAL HIGH (ref 70–99)
Potassium: 3.7 mmol/L (ref 3.5–5.1)
Sodium: 159 mmol/L — ABNORMAL HIGH (ref 135–145)

## 2019-10-21 LAB — GLUCOSE, CAPILLARY
Glucose-Capillary: 105 mg/dL — ABNORMAL HIGH (ref 70–99)
Glucose-Capillary: 109 mg/dL — ABNORMAL HIGH (ref 70–99)
Glucose-Capillary: 112 mg/dL — ABNORMAL HIGH (ref 70–99)
Glucose-Capillary: 123 mg/dL — ABNORMAL HIGH (ref 70–99)
Glucose-Capillary: 126 mg/dL — ABNORMAL HIGH (ref 70–99)
Glucose-Capillary: 130 mg/dL — ABNORMAL HIGH (ref 70–99)

## 2019-10-21 LAB — SODIUM
Sodium: 156 mmol/L — ABNORMAL HIGH (ref 135–145)
Sodium: 157 mmol/L — ABNORMAL HIGH (ref 135–145)
Sodium: 158 mmol/L — ABNORMAL HIGH (ref 135–145)

## 2019-10-21 LAB — CBC
HCT: 39.2 % (ref 36.0–46.0)
Hemoglobin: 12.1 g/dL (ref 12.0–15.0)
MCH: 30.4 pg (ref 26.0–34.0)
MCHC: 30.9 g/dL (ref 30.0–36.0)
MCV: 98.5 fL (ref 80.0–100.0)
Platelets: 177 10*3/uL (ref 150–400)
RBC: 3.98 MIL/uL (ref 3.87–5.11)
RDW: 16.5 % — ABNORMAL HIGH (ref 11.5–15.5)
WBC: 14.2 10*3/uL — ABNORMAL HIGH (ref 4.0–10.5)
nRBC: 0 % (ref 0.0–0.2)

## 2019-10-21 LAB — PHOSPHORUS
Phosphorus: 2.9 mg/dL (ref 2.5–4.6)
Phosphorus: 3.3 mg/dL (ref 2.5–4.6)

## 2019-10-21 LAB — MAGNESIUM
Magnesium: 2 mg/dL (ref 1.7–2.4)
Magnesium: 2 mg/dL (ref 1.7–2.4)

## 2019-10-21 LAB — TRIGLYCERIDES: Triglycerides: 143 mg/dL (ref <150)

## 2019-10-21 MED ORDER — LOSARTAN POTASSIUM 50 MG PO TABS
50.0000 mg | ORAL_TABLET | Freq: Two times a day (BID) | ORAL | Status: DC
  Filled 2019-10-21: qty 1

## 2019-10-21 MED ORDER — POLYETHYLENE GLYCOL 3350 17 G PO PACK
17.0000 g | PACK | Freq: Two times a day (BID) | ORAL | Status: DC
  Administered 2019-10-21 – 2019-10-23 (×6): 17 g
  Filled 2019-10-21 (×7): qty 1

## 2019-10-21 MED ORDER — LOSARTAN POTASSIUM 50 MG PO TABS
50.0000 mg | ORAL_TABLET | Freq: Two times a day (BID) | ORAL | Status: DC
  Administered 2019-10-21 – 2019-10-25 (×9): 50 mg
  Filled 2019-10-21 (×8): qty 1

## 2019-10-21 MED ORDER — LABETALOL HCL 100 MG PO TABS
300.0000 mg | ORAL_TABLET | Freq: Three times a day (TID) | ORAL | Status: DC
  Administered 2019-10-21 – 2019-10-23 (×8): 300 mg
  Filled 2019-10-21 (×8): qty 3

## 2019-10-21 MED ORDER — SODIUM CHLORIDE 0.9 % IV SOLN: INTRAVENOUS | Status: AC

## 2019-10-21 NOTE — Progress Notes (Signed)
NAME:  Debra Huff, MRN:  [id number], DOB:  [birth year], LOS: 2 ADMISSION DATE:  10/26/2019, CONSULTATION DATE:  10/21/19 REFERRING MD:  Otelia Limes, CHIEF COMPLAINT:  Hemorrhagic CVA   Brief History    Debra Huff is a 49 y.o. F with HTN for which she has not sought medical care or been taking medications for years per her sister.  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   Seen by neurology and neurosurgery and deemed not a surgical candidate.  Intubated and PCCM consulted for vent management.   Extremely hypertensive 269/136 and started on Cleviprex gtt.  Past Medical History  HTN  Significant Hospital Events   4/18 Admit to ICU, neurology primary  4/20 -= No acute events.  ON cleviprex and labetalol now to maintain SBP < 140. Remains on 3%. Tolerating PSV wean on 5/5 most of the day 4/19.  Consults:  Neurosurgery  PCCM  Procedures:  4/19 ETT 4/19 R IJ CVC  Significant Diagnostic Tests:  10/21/19 CT head>> Large intraparenchymal hematoma centered in the left basal ganglia with subarachnoid extension and 6 mm rightward midlinesShift. Right lateral ventricle entrapment with dilatation of the atrium and temporal horn.  CTA head 4/19 > stable basal ganglia and corona radiata hemorrhage.  Stable IVH, stable 7mm MLS.  Stable dilatation of right temporal tip.  Micro Data:  10/21/19 Sars-Cov-2>>negative   Antimicrobials:    Interim history/subjective:   4/21 - unresponsive on vent. Family of husband, dad and 2 sons at bedside for goals of care with Dr Roda Shutters   Objective   Blood pressure (!) 152/68, pulse 91, temperature 99.9 F (37.7 C), temperature source Axillary, resp. rate 19, height  (1.651 m), weight 101.1 kg, SpO2 96 %.    Vent Mode: PSV;CPAP FiO2 (%):  [40 %] 40 % Set Rate:  [18 bmp] 18 bmp Vt Set:  [450 mL]  450 mL PEEP:  [5 cmH20] 5 cmH20 Pressure Support:  [5 cmH20] 5 cmH20 Plateau Pressure:  [19 cmH20] 19 cmH20   Intake/Output Summary (Last 24 hours) at 10/21/2019 1039 Last data filed at 10/21/2019 1030 Gross per 24 hour  Intake 3148.29 ml  Output 3275 ml  Net -126.71 ml   Filed Weights   10/21/2019 2321 10/21/19 0500  Weight: 103.8 kg 101.1 kg    Physical Exam: General Appearance:  Looks criticall ill OBESE - + Head:  Normocephalic, without obvious abnormality, atraumatic Eyes: , conjunctiva/corneas - muddy3     Ears:  Normal external ear canals, both ears Nose:  G tube - no Throat:  ETT TUBE - yes , OG tube - yes Neck:  Supple,  No enlargement/tenderness/nodules Lungs: Clear to auscultation bilaterally, Ventilator   Synchrony - yes on PSV Heart:  S1 and S2 normal, no murmur, CVP - no.  Pressors - no but on cleviprex Abdomen:  Soft, no masses, no organomegaly Genitalia / Rectal:  Not done Extremities:  Extremities- intact Skin:  ntact in exposed areas . Sacral area - x Neurologic:  Sedation - nne -> RASS - -5 .    Assessment & Plan:   Basal ganglia ICH - Not on AC at home.  Evaluated by neurosurgery and deemed to not be a surgical candidate.  10/21/2019 - remains unresponsiove  plan  - Cleviprex and labetalol to maintain SBP between 100-140 per neuro. - Labetalol PRN. -  Continue hypertonic saline with frequent Na checks, goal Na 150 - 155. - Goals of care per neuro. - Neurosurgery following for possibility of EVD placement.  Respiratory failure - Pateint unable to protect airway and intubated in ED.   10/21/2019 - > does not meet criteria for SBT/Extubation in setting of Acute Respiratory Failure due to coma  Plan -  PRVC - Mental status will likely be major barrier to extubation. - Might need trach if family wishes for continued aggressive care. - Follow CXR.   Rest per primary team.  Best practice:  Diet: NPO. Pain/Anxiety/Delirium protocol (if indicated):  propofol gtt as needed (currently off). VAP protocol (if indicated): HOB at 30 degrees. DVT prophylaxis: No DVT prophylaxis indicated given ICH GI prophylaxis: Protonix. Glucose control: SSI if glucose consistently > 180. Mobility: NA. Code Status: Full code. Family Communication: None available this AM.  Goals of care discussions with family per neuro given poor prognosis. Disposition: ICU for now.     ATTESTATION & SIGNATURE   The patient Debra Huff is critically ill with multiple organ systems failure and requires high complexity decision making for assessment and support, frequent evaluation and titration of therapies, application of advanced monitoring technologies and extensive interpretation of multiple databases.   Critical Care Time devoted to patient care services described in this note is  30  Minutes. This time reflects time of care of this signee Dr Kalman ShanMurali Cane Dubray. This critical care time does not reflect procedure time, or teaching time or supervisory time of PA/NP/Med student/Med Resident etc but could involve care discussion time     Dr. Kalman ShanMurali Simmone Cape, M.D., [facility] Pulmonary and Critical Care Medicine Staff Physician [facility] Pager: [phone], If no answer or between  15:00h - 7:00h: call [phone]  10/21/2019 10:39 AM     LABS    PULMONARY Recent Labs  Lab 22-Jun-2020 2326  TCO2 25    CBC Recent Labs  Lab 22-Jun-2020 2319 22-Jun-2020 2319 22-Jun-2020 2326 10/20/19 0245 10/21/19 0326  HGB 15.5*   < > 16.3* 13.5 12.1  HCT 48.9*   < > 48.0* 42.3 39.2  WBC 11.6*  --   --  17.9* 14.2*  PLT 235  --   --  234 177   < > = values in this interval not displayed.    COAGULATION Recent Labs  Lab 22-Jun-2020 2319  INR 1.0    CARDIAC  No results for input(s): TROPONINI in the last 168 hours. No results for input(s): PROBNP in the last 168 hours.   CHEMISTRY Recent Labs  Lab 22-Jun-2020 2319  22-Jun-2020 2319 22-Jun-2020 2326 10/19/19 0913 10/20/19 0245 10/20/19 0914 10/20/19 1500 10/20/19 1625 10/20/19 2051 10/21/19 0326  NA 139   < > 137   < > 148*  149* 153* 157*  --  156* 159*  K 4.4   < > 3.8  --  3.4*  --   --   --   --  3.7  CL 106  --  105  --  121*  --   --   --   --  >130*  CO2 21*  --   --   --  21*  --   --   --   --  21*  GLUCOSE 104*  --  110*  --  159*  --   --   --   --  146*  BUN 21*  --  24*  --  18  --   --   --   --  19  CREATININE 0.98  --  1.00  --  1.03*  --   --   --   --  0.91  CALCIUM 9.2  --   --   --  8.4*  --   --   --   --  8.6*  MG  --   --   --   --  2.1 1.9  --  2.0  --  2.0  PHOS  --   --   --   --  2.2* 1.9*  --  2.5  --  2.9   < > = values in this interval not displayed.   Estimated Creatinine Clearance: 88.1 mL/min (by C-G formula based on SCr of 0.91 mg/dL).   LIVER Recent Labs  Lab 10/07/2019 2319  AST 25  ALT 22  ALKPHOS 82  BILITOT 0.6  PROT 7.5  ALBUMIN 3.8  INR 1.0     INFECTIOUS Recent Labs  Lab 10/19/19 0913  LATICACIDVEN 1.9     ENDOCRINE CBG (last 3)  Recent Labs    10/20/19 2337 10/21/19 0328 10/21/19 0751  GLUCAP 117* 126* 112*         IMAGING x48h  - image(s) personally visualized  -   highlighted in bold CT ANGIO HEAD W OR WO CONTRAST  Result Date: 10/20/2019 CLINICAL DATA:  Left corona radiata and basal ganglia parenchymal hemorrhage with extension into the ventricles. EXAM: CT ANGIOGRAPHY HEAD AND NECK TECHNIQUE: Multidetector CT imaging of the head and neck was performed using the standard protocol during bolus administration of intravenous contrast. Multiplanar CT image reconstructions and MIPs were obtained to evaluate the vascular anatomy. Carotid stenosis measurements (when applicable) are obtained utilizing NASCET criteria, using the distal internal carotid diameter as the denominator. CONTRAST:  57mL OMNIPAQUE IOHEXOL 350 MG/ML SOLN COMPARISON:  CT head without contrast 10/19/19 and  10/28/2019. FINDINGS: CT HEAD FINDINGS Brain: Left basal ganglia and corona radiata hemorrhage is stable to slightly smaller, measuring 3.6 x 6.5 x 3.3 cm. Intraventricular extension and posterior layering is stable. Dilated right temporal tip is stable. 7 mm midline shift is stable. Surrounding edema is noted. Sulci are effaced. Blood is present in the fourth ventricle and aqueduct. Vascular: Atherosclerotic calcifications are present within the cavernous internal carotid arteries bilaterally. Skull: Calvarium is intact. No focal lytic or blastic lesions are present. No significant extracranial soft tissue lesion is present. Sinuses: The paranasal sinuses and mastoid air cells are clear. Orbits: The globes and orbits are within normal limits. Review of the MIP images confirms the above findings CTA NECK FINDINGS Aortic arch: A 3 vessel arch configuration is present. Atherosclerotic changes are noted at the arch without significant stenosis. Right carotid system: The right common carotid artery is within normal limits. Bifurcation is unremarkable. Cervical right ICA is normal. Left carotid system: The left common carotid artery is within normal limits. Mild atherosclerotic changes are noted at the bifurcation. Cervical left ICA is normal. Vertebral arteries: The vertebral arteries are codominant. Both vertebral arteries originate from the subclavian arteries without significant stenosis. No significant stenosis is present in either vertebral artery in the neck. Skeleton: Vertebral body heights are maintained. No significant listhesis is present. Straightening of the normal cervical lordosis is noted. The patient is edentulous. Other neck: Patient is intubated. OG tube is in place. Right IJ line is normal. Soft tissues are otherwise unremarkable. Upper chest: A  small right pleural effusion is present. Emphysematous changes are noted. Upper lung fields are otherwise clear. Review of the MIP images confirms the above  findings CTA HEAD FINDINGS Anterior circulation: Atherosclerotic changes are present within the cavernous internal carotid arteries bilaterally. No significant stenosis is present. The A1 and M1 segments are normal. The anterior communicating artery is patent. MCA bifurcations are intact. The ACA and MCA branch vessels are within normal limits. Posterior circulation: The vertebral arteries are codominant. PICA origins are visualized and normal. The vertebrobasilar junction is normal. Basilar artery is normal. Both posterior cerebral arteries originate from basilar tip. PCA branch vessels are within normal limits bilaterally. Venous sinuses: The dural sinuses are patent. The straight sinus is and deep cerebral veins are intact. Cortical veins are unremarkable. No vascular malformation is evident. Anatomic variants: None Review of the MIP images confirms the above findings IMPRESSION: 1. Stable to slightly smaller left basal ganglia and corona radiata hemorrhage. 2. Stable intraventricular extension of hemorrhage. 3. Stable 7 mm midline shift. 4. Stable dilatation of the right temporal tip. 5. Normal CTA circle-of-Willis without significant proximal stenosis, aneurysm, or branch vessel occlusion. No acute or focal lesion to explain the hemorrhage. 6. Atherosclerotic changes at the left carotid bifurcation and cavernous internal carotid arteries bilaterally without significant stenosis. Electronically Signed   By: Marin Roberts M.D.   On: 10/20/2019 07:36   CT ANGIO NECK W OR WO CONTRAST  Result Date: 10/20/2019 CLINICAL DATA:  Left corona radiata and basal ganglia parenchymal hemorrhage with extension into the ventricles. EXAM: CT ANGIOGRAPHY HEAD AND NECK TECHNIQUE: Multidetector CT imaging of the head and neck was performed using the standard protocol during bolus administration of intravenous contrast. Multiplanar CT image reconstructions and MIPs were obtained to evaluate the vascular anatomy. Carotid  stenosis measurements (when applicable) are obtained utilizing NASCET criteria, using the distal internal carotid diameter as the denominator. CONTRAST:  76mL OMNIPAQUE IOHEXOL 350 MG/ML SOLN COMPARISON:  CT head without contrast 10/19/19 and 10/17/2019. FINDINGS: CT HEAD FINDINGS Brain: Left basal ganglia and corona radiata hemorrhage is stable to slightly smaller, measuring 3.6 x 6.5 x 3.3 cm. Intraventricular extension and posterior layering is stable. Dilated right temporal tip is stable. 7 mm midline shift is stable. Surrounding edema is noted. Sulci are effaced. Blood is present in the fourth ventricle and aqueduct. Vascular: Atherosclerotic calcifications are present within the cavernous internal carotid arteries bilaterally. Skull: Calvarium is intact. No focal lytic or blastic lesions are present. No significant extracranial soft tissue lesion is present. Sinuses: The paranasal sinuses and mastoid air cells are clear. Orbits: The globes and orbits are within normal limits. Review of the MIP images confirms the above findings CTA NECK FINDINGS Aortic arch: A 3 vessel arch configuration is present. Atherosclerotic changes are noted at the arch without significant stenosis. Right carotid system: The right common carotid artery is within normal limits. Bifurcation is unremarkable. Cervical right ICA is normal. Left carotid system: The left common carotid artery is within normal limits. Mild atherosclerotic changes are noted at the bifurcation. Cervical left ICA is normal. Vertebral arteries: The vertebral arteries are codominant. Both vertebral arteries originate from the subclavian arteries without significant stenosis. No significant stenosis is present in either vertebral artery in the neck. Skeleton: Vertebral body heights are maintained. No significant listhesis is present. Straightening of the normal cervical lordosis is noted. The patient is edentulous. Other neck: Patient is intubated. OG tube is in place.  Right IJ line is normal. Soft tissues are  otherwise unremarkable. Upper chest: A small right pleural effusion is present. Emphysematous changes are noted. Upper lung fields are otherwise clear. Review of the MIP images confirms the above findings CTA HEAD FINDINGS Anterior circulation: Atherosclerotic changes are present within the cavernous internal carotid arteries bilaterally. No significant stenosis is present. The A1 and M1 segments are normal. The anterior communicating artery is patent. MCA bifurcations are intact. The ACA and MCA branch vessels are within normal limits. Posterior circulation: The vertebral arteries are codominant. PICA origins are visualized and normal. The vertebrobasilar junction is normal. Basilar artery is normal. Both posterior cerebral arteries originate from basilar tip. PCA branch vessels are within normal limits bilaterally. Venous sinuses: The dural sinuses are patent. The straight sinus is and deep cerebral veins are intact. Cortical veins are unremarkable. No vascular malformation is evident. Anatomic variants: None Review of the MIP images confirms the above findings IMPRESSION: 1. Stable to slightly smaller left basal ganglia and corona radiata hemorrhage. 2. Stable intraventricular extension of hemorrhage. 3. Stable 7 mm midline shift. 4. Stable dilatation of the right temporal tip. 5. Normal CTA circle-of-Willis without significant proximal stenosis, aneurysm, or branch vessel occlusion. No acute or focal lesion to explain the hemorrhage. 6. Atherosclerotic changes at the left carotid bifurcation and cavernous internal carotid arteries bilaterally without significant stenosis. Electronically Signed   By: Marin Roberts M.D.   On: 10/20/2019 07:36   DG Chest Port 1 View  Result Date: 10/20/2019 CLINICAL DATA:  Intercerebral hemorrhage. EXAM: PORTABLE CHEST 1 VIEW COMPARISON:  10/19/2019 FINDINGS: The endotracheal tube, NG tube and right IJ catheters are stable. The  cardiac silhouette, mediastinal and hilar contours are within normal limits and stable. Much improved lung aeration with resolving edema and atelectasis. IMPRESSION: 1. Stable support apparatus. 2. Much improved lung aeration with resolving edema and atelectasis. Electronically Signed   By: Rudie Meyer M.D.   On: 10/20/2019 08:18   ECHOCARDIOGRAM COMPLETE  Result Date: 10/19/2019    ECHOCARDIOGRAM REPORT   Patient Name:   LAKYN ALSTEEN Date of Exam: 10/19/2019 Medical Rec #:  [id number]     Height:       65.0 in Accession #:    [id number]    Weight:       228.8 lb Date of Birth:  [birth year]     BSA:          2.095 m Patient Age:    48 years      BP:           130/65 mmHg Patient Gender: F             HR:           86 bpm. Exam Location:  Inpatient Procedure: 2D Echo Indications:    Stroke 434.91 / I163.9  History:        Patient has no prior history of Echocardiogram examinations.                 Blood pressure elevated without history of HTN, Intracerebral                 hemorrhage, Respiratory failure , she has not sought medical                 care or been taking medications for years per her sister.  Sonographer:    Jeryl Columbia Referring Phys: [id number] JINDONG XU IMPRESSIONS  1. Left ventricular ejection fraction, by estimation, is 60 to 65%. The left ventricle has  normal function. The left ventricle has no regional wall motion abnormalities. Left ventricular diastolic parameters are consistent with Grade I diastolic dysfunction (impaired relaxation). Elevated left atrial pressure.  2. Right ventricular systolic function is normal. The right ventricular size is normal.  3. Thickened anterior chordae. The mitral valve is normal in structure. No evidence of mitral valve regurgitation. No evidence of mitral stenosis.  4. The aortic valve is normal in structure. Aortic valve regurgitation is not visualized. Mild aortic valve sclerosis is present, with no evidence of aortic valve stenosis.  5. The inferior  vena cava is normal in size with greater than 50% respiratory variability, suggesting right atrial pressure of 3 mmHg. Conclusion(s)/Recommendation(s): No intracardiac source of embolism detected on this transthoracic study. A transesophageal echocardiogram is recommended to exclude cardiac source of embolism if clinically indicated. FINDINGS  Left Ventricle: Left ventricular ejection fraction, by estimation, is 60 to 65%. The left ventricle has normal function. The left ventricle has no regional wall motion abnormalities. The left ventricular internal cavity size was normal in size. There is  no left ventricular hypertrophy. Left ventricular diastolic parameters are consistent with Grade I diastolic dysfunction (impaired relaxation). Elevated left atrial pressure. Right Ventricle: The right ventricular size is normal. No increase in right ventricular wall thickness. Right ventricular systolic function is normal. Left Atrium: Left atrial size was normal in size. Right Atrium: Right atrial size was normal in size. Pericardium: There is no evidence of pericardial effusion. Mitral Valve: Thickened anterior chordae. The mitral valve is normal in structure. Normal mobility of the mitral valve leaflets. No evidence of mitral valve regurgitation. No evidence of mitral valve stenosis. Tricuspid Valve: The tricuspid valve is normal in structure. Tricuspid valve regurgitation is not demonstrated. No evidence of tricuspid stenosis. Aortic Valve: The aortic valve is normal in structure. Aortic valve regurgitation is not visualized. Mild aortic valve sclerosis is present, with no evidence of aortic valve stenosis. Pulmonic Valve: The pulmonic valve was normal in structure. Pulmonic valve regurgitation is not visualized. No evidence of pulmonic stenosis. Aorta: The aortic root is normal in size and structure. Venous: The inferior vena cava is normal in size with greater than 50% respiratory variability, suggesting right atrial  pressure of 3 mmHg. IAS/Shunts: No atrial level shunt detected by color flow Doppler.  LEFT VENTRICLE PLAX 2D LVIDd:         5.60 cm  Diastology LVIDs:         3.40 cm  LV e' lateral:   5.66 cm/s LV PW:         1.10 cm  LV E/e' lateral: 17.1 LV IVS:        1.30 cm  LV e' medial:    5.44 cm/s LVOT diam:     2.10 cm  LV E/e' medial:  17.8 LVOT Area:     3.46 cm  RIGHT VENTRICLE RV S prime:     14.90 cm/s TAPSE (M-mode): 1.9 cm LEFT ATRIUM             Index       RIGHT ATRIUM           Index LA diam:        3.50 cm 1.67 cm/m  RA Area:     10.90 cm LA Vol (A2C):   45.4 ml 21.67 ml/m RA Volume:   21.80 ml  10.41 ml/m LA Vol (A4C):   48.1 ml 22.96 ml/m LA Biplane Vol: 47.5 ml 22.68 ml/m   AORTA Ao  Root diam: 3.60 cm MITRAL VALVE MV Area (PHT): 3.17 cm     SHUNTS MV Decel Time: 239 msec     Systemic Diam: 2.10 cm MV E velocity: 97.00 cm/s MV A velocity: 113.00 cm/s MV E/A ratio:  0.86 Donato Schultz MD Electronically signed by Donato Schultz MD Signature Date/Time: 10/19/2019/2:31:16 PM    Final

## 2019-10-21 NOTE — Progress Notes (Signed)
STROKE TEAM PROGRESS NOTE   INTERVAL HISTORY Husband, father and two sons are at the bedside. Pt still intubated, off sedation, withdraw to pain around left upper and lower and right lower extremities.  Still has flaccid on the right upper extremity.  CT repeat stable hematoma and midline shift.  Vitals:   10/21/19 0800 10/21/19 0900 10/21/19 1000 10/21/19 1100  BP: (!) 158/83 (!) 174/80 (!) 152/68 140/68  Pulse: 96 94 91 93  Resp: (!) 22 19 19 20   Temp: 99.9 F (37.7 C)     TempSrc: Axillary     SpO2: 98% 100% 96% 97%  Weight:      Height:        CBC:  Recent Labs  Lab 11-03-19 2319 2019/11/03 2326 10/20/19 0245 10/21/19 0326  WBC 11.6*   < > 17.9* 14.2*  NEUTROABS 5.5  --   --   --   HGB 15.5*   < > 13.5 12.1  HCT 48.9*   < > 42.3 39.2  MCV 97.8   < > 95.7 98.5  PLT 235   < > 234 177   < > = values in this interval not displayed.    Basic Metabolic Panel:  Recent Labs  Lab 10/20/19 0245 10/20/19 0914 10/20/19 1625 10/20/19 2051 10/21/19 0326 10/21/19 0944  NA 148*  149*   < >  --    < > 159* 156*  K 3.4*  --   --   --  3.7  --   CL 121*  --   --   --  >130*  --   CO2 21*  --   --   --  21*  --   GLUCOSE 159*  --   --   --  146*  --   BUN 18  --   --   --  19  --   CREATININE 1.03*  --   --   --  0.91  --   CALCIUM 8.4*  --   --   --  8.6*  --   MG 2.1   < > 2.0  --  2.0  --   PHOS 2.2*   < > 2.5  --  2.9  --    < > = values in this interval not displayed.   Lipid Panel:     Component Value Date/Time   CHOL 152 10/20/2019 0245   TRIG 143 10/21/2019 0944   HDL 46 10/20/2019 0245   CHOLHDL 3.3 10/20/2019 0245   VLDL 22 10/20/2019 0245   LDLCALC 84 10/20/2019 0245   HgbA1c:  Lab Results  Component Value Date   HGBA1C 5.7 (H) 10/20/2019   Urine Drug Screen:     Component Value Date/Time   LABOPIA NONE DETECTED 10/19/2019 0600   COCAINSCRNUR NONE DETECTED 10/19/2019 0600   LABBENZ NONE DETECTED 10/19/2019 0600   AMPHETMU POSITIVE (A) 10/19/2019  0600   THCU NONE DETECTED 10/19/2019 0600   LABBARB NONE DETECTED 10/19/2019 0600    Alcohol Level     Component Value Date/Time   ETH <10 10/19/2019 0057    IMAGING past 24 hours No results found.  PHYSICAL EXAM   Temp:  [99 F (37.2 C)-100.6 F (38.1 C)] 99.9 F (37.7 C) (04/21 0800) Pulse Rate:  [76-101] 93 (04/21 1100) Resp:  [15-37] 20 (04/21 1100) BP: (91-174)/(59-114) 140/68 (04/21 1100) SpO2:  [95 %-100 %] 97 % (04/21 1100) FiO2 (%):  [40 %] 40 % (04/21 0751) Weight:  [  101.1 kg] 101.1 kg (04/21 0500)  General - Well nourished, well developed, intubated off sedation.  Ophthalmologic - fundi not visualized due to noncooperation.  Cardiovascular - Regular rate and rhythm.  Neuro - intubated off propofol, eyes closed, not following commands. With forced eye opening, eyes in mid position, not blinking to visual threat, doll's eyes sluggish, not tracking, PERRL. Corneal reflex weak on the left, absent on the right, gag and cough present. Breathing  over the vent.  Facial symmetry not able to test due to ET tube.  Tongue protrusion not cooperative. On pain stimulation, withdraw to LUE and LLE as well as RLE but not RUE. DTR 1+ and bilateral positive babinski. Sensation, coordination and gait not tested.   ASSESSMENT/PLAN Debra Huff is a 49 y.o. female with uncontrolled HTN presenting with R sided weakness, R facial droop, diaphoreses, leaning to the R and unresponsive. Found to be twitching all over w/ pinpoint pupils and SBP > 260.   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 R lateral ventricle entrapment w/ dilatation of atrium and temporal horn.   Repeat CT head 4/19 mild extension large L basal ganglia ICH. Stable IVH. Stable ventriculomegaly. 30mm midline shift. No new abnormality.  CT head 4/20  slightly smaller L basal ganglia and corona radiata ICH. Stable IVH. Stable 64mm midline shift. Stable dilation R temporal horn.    CTA head & neck No intracranial atherosclerosis. Atherosclerosis L ICA bifurcation and B cavernous ICAs.  CT head 4/21 unchanged hematoma and midline shift.  2D Echo EF 60-65%  LDL 84  HgbA1c 5.7  SCDs for VTE prophylaxis  No antithrombotic prior to admission, now on No antithrombotic given hemorrhage   Therapy recommendations:  reconsult when stable for evaluation  Disposition:  Pending  ICH score = 3   Acute Respiratory Failure d/t ICH  Intubated in ED  CCM on board   Off sedation  Wean off vent if able   Likely will need trach for ongoing aggressive care  Cerebral Edema  NSG consulted [name]) no role for surgical intervention on admission. Following for possible EVD.  CT head 4/19 6->64mm midline shift   CT head 4/20 stable 84mm midline shift  CT head 4/21 stable 7 mm midline shift  On 3% @ 50/h  23.4% saline x 1  Goal Na 150-155  Na 139->137->141->143->147->149->156->159->156->157  Na Q6h  Hypertensive Emergency  Home meds:  None  SBP > 260 on arrival . SBP goal < 160  On IV Cleviprex  off labetalol gtt  Added norvasc 10, labetalol 200 tid->300 tid  Add losartan 50 bid . Long-term BP goal normotensive  Hyperlipidemia  LDL 84, goal LDL < 70   on no statin PTA  Add statin on discharge  Dysphagia . Secondary to stroke . NPO . On tube feedings @ 40cc w/ prostat . Speech on board   Other Stroke Risk Factors  UDS:  Amphetamines POSITIVE - cessation education will be provided  Obesity, Body mass index is 37.09 kg/m., recommend weight loss, diet and exercise as appropriate   Other Active Problems  Leukocytosis WBC 11.6->17.9->14.2  Polycythemia Hb 15.5->16.3->13.5->12.1  - ?? Smoker - resolved  Hypokalemia 3.4->3.7 - supplemented - resolved  Hypophosphatemia 2.2->1.9->2.5->2.9  - supplemented -  resolved  Urinary retention, foley placed  Constipation. Bowel regimen added.  Hospital day # 2  This patient is critically ill due to large ICH, IVH, SAH, ventriculomegaly, hypertensive emergency and at significant risk of neurological worsening, death form hematoma expansion, cerebral edema, brain herniation, seizure, hydrocephalus, heart failure. This patient's care requires constant monitoring of vital signs, hemodynamics, respiratory and cardiac monitoring, review of multiple databases, neurological assessment, discussion with family, other specialists and medical decision making of high complexity. I spent 40 minutes of neurocritical care time in the care of this patient. I had long discussion with husband, patient father and 2 sons at bedside, updated pt current condition, reviewed neuro images, discussed about treatment plan and potential prognosis, and answered all the questions. They expressed understanding and appreciation.    Marvel Plan, MD PhD Stroke Neurology 10/21/2019 11:54 AM  To contact Stroke Continuity provider, please refer to [email or web address]. After hours, contact General Neurology

## 2019-10-21 NOTE — Progress Notes (Signed)
Na level now 159, above goal of 150-155. Holding hypertonic saline for 6 hrs until next Na level. Will start infusion of normal saline at 50 cc/hr - order expires in 6 hours.   Electronically signed: Dr. Caryl Pina

## 2019-10-22 DIAGNOSIS — Z978 Presence of other specified devices: Secondary | ICD-10-CM

## 2019-10-22 LAB — BASIC METABOLIC PANEL
Anion gap: 8 (ref 5–15)
BUN: 24 mg/dL — ABNORMAL HIGH (ref 6–20)
CO2: 23 mmol/L (ref 22–32)
Calcium: 8.6 mg/dL — ABNORMAL LOW (ref 8.9–10.3)
Chloride: 127 mmol/L — ABNORMAL HIGH (ref 98–111)
Creatinine, Ser: 0.9 mg/dL (ref 0.44–1.00)
GFR calc Af Amer: >60 mL/min (ref >60)
GFR calc non Af Amer: >60 mL/min (ref >60)
Glucose, Bld: 110 mg/dL — ABNORMAL HIGH (ref 70–99)
Potassium: 3.8 mmol/L (ref 3.5–5.1)
Sodium: 158 mmol/L — ABNORMAL HIGH (ref 135–145)

## 2019-10-22 LAB — CBC
HCT: 40.5 % (ref 36.0–46.0)
Hemoglobin: 12.3 g/dL (ref 12.0–15.0)
MCH: 30.6 pg (ref 26.0–34.0)
MCHC: 30.4 g/dL (ref 30.0–36.0)
MCV: 100.7 fL — ABNORMAL HIGH (ref 80.0–100.0)
Platelets: 182 10*3/uL (ref 150–400)
RBC: 4.02 MIL/uL (ref 3.87–5.11)
RDW: 16.6 % — ABNORMAL HIGH (ref 11.5–15.5)
WBC: 12.6 10*3/uL — ABNORMAL HIGH (ref 4.0–10.5)
nRBC: 0.2 % (ref 0.0–0.2)

## 2019-10-22 LAB — GLUCOSE, CAPILLARY
Glucose-Capillary: 124 mg/dL — ABNORMAL HIGH (ref 70–99)
Glucose-Capillary: 107 mg/dL — ABNORMAL HIGH (ref 70–99)
Glucose-Capillary: 107 mg/dL — ABNORMAL HIGH (ref 70–99)
Glucose-Capillary: 116 mg/dL — ABNORMAL HIGH (ref 70–99)
Glucose-Capillary: 125 mg/dL — ABNORMAL HIGH (ref 70–99)
Glucose-Capillary: 136 mg/dL — ABNORMAL HIGH (ref 70–99)

## 2019-10-22 LAB — SODIUM
Sodium: 158 mmol/L — ABNORMAL HIGH (ref 135–145)
Sodium: 157 mmol/L — ABNORMAL HIGH (ref 135–145)
Sodium: 152 mmol/L — ABNORMAL HIGH (ref 135–145)

## 2019-10-22 MED ORDER — HYDRALAZINE HCL 50 MG PO TABS: 50.0000 mg | ORAL_TABLET | Freq: Three times a day (TID) | ORAL | Status: DC

## 2019-10-22 MED ORDER — HYDRALAZINE HCL 50 MG PO TABS
50.0000 mg | ORAL_TABLET | Freq: Three times a day (TID) | ORAL | Status: DC
  Administered 2019-10-22 (×2): 50 mg
  Filled 2019-10-22 (×2): qty 1

## 2019-10-22 MED ORDER — HYDRALAZINE HCL 50 MG PO TABS
100.0000 mg | ORAL_TABLET | Freq: Three times a day (TID) | ORAL | Status: DC
  Administered 2019-10-22 – 2019-10-26 (×11): 100 mg
  Filled 2019-10-22 (×11): qty 2

## 2019-10-22 NOTE — Progress Notes (Signed)
SLP Cancellation Note  Patient Details Name: Debra Huff MRN: [id number] DOB: July 28, 1970   Cancelled treatment:         Patient not medically ready. Pt remains intubated.  SLP to sign off. Please reconsult when  Appropriate.  Jamond Neels L. Samson Frederic, MA CCC/SLP Acute Rehabilitation Services Office number [phone] Pager [phone]    Blenda Mounts Laurice 10/22/2019, 9:21 AM

## 2019-10-22 NOTE — Progress Notes (Signed)
STROKE TEAM PROGRESS NOTE   INTERVAL HISTORY No family is at bedside. Pt neuro unchanged, still intubated on vent on weaning. PERRL, not following commands, strong withdraw on the LUE and LLE with pain stimulation but less movement at RLE and no movement of RUE. CT yesterday showed stable hematoma and MLS.    Vitals:   10/22/19 0900 10/22/19 0930 10/22/19 1000 10/22/19 1142  BP: (!) 187/86 (!) 157/61 (!) 161/67 (!) 160/72  Pulse: 95 79 86 92  Resp: (!) 22 (!) 21 (!) 28 (!) 26  Temp:      TempSrc:      SpO2: 98% 97% 99% 98%  Weight:      Height:        CBC:  Recent Labs  Lab 12-Nov-2019 2319 11/12/2019 2326 10/21/19 0326 10/22/19 0810  WBC 11.6*   < > 14.2* 12.6*  NEUTROABS 5.5  --   --   --   HGB 15.5*   < > 12.1 12.3  HCT 48.9*   < > 39.2 40.5  MCV 97.8   < > 98.5 100.7*  PLT 235   < > 177 182   < > = values in this interval not displayed.    Basic Metabolic Panel:  Recent Labs  Lab 10/21/19 0326 10/21/19 0944 10/21/19 1722 10/21/19 2051 10/22/19 0250 10/22/19 0810  NA 159*   < >  --    < > 158* 158*  K 3.7  --   --   --   --  3.8  CL >130*  --   --   --   --  127*  CO2 21*  --   --   --   --  23  GLUCOSE 146*  --   --   --   --  110*  BUN 19  --   --   --   --  24*  CREATININE 0.91  --   --   --   --  0.90  CALCIUM 8.6*  --   --   --   --  8.6*  MG 2.0  --  2.0  --   --   --   PHOS 2.9  --  3.3  --   --   --    < > = values in this interval not displayed.   Lipid Panel:     Component Value Date/Time   CHOL 152 10/20/2019 0245   TRIG 143 10/21/2019 0944   HDL 46 10/20/2019 0245   CHOLHDL 3.3 10/20/2019 0245   VLDL 22 10/20/2019 0245   LDLCALC 84 10/20/2019 0245   HgbA1c:  Lab Results  Component Value Date   HGBA1C 5.7 (H) 10/20/2019   Urine Drug Screen:     Component Value Date/Time   LABOPIA NONE DETECTED 10/19/2019 0600   COCAINSCRNUR NONE DETECTED 10/19/2019 0600   LABBENZ NONE DETECTED 10/19/2019 0600   AMPHETMU POSITIVE (A) 10/19/2019 0600    THCU NONE DETECTED 10/19/2019 0600   LABBARB NONE DETECTED 10/19/2019 0600    Alcohol Level     Component Value Date/Time   ETH <10 10/19/2019 0057    IMAGING past 24 hours CT HEAD WO CONTRAST  Result Date: 10/21/2019 CLINICAL DATA:  Stroke follow-up. Intracranial hemorrhage. EXAM: CT HEAD WITHOUT CONTRAST TECHNIQUE: Contiguous axial images were obtained from the base of the skull through the vertex without intravenous contrast. COMPARISON:  10/20/2019 FINDINGS: Brain: A large parenchymal hemorrhage centered in the left basal ganglia has not  significantly changed in size, measuring approximately 6.7 x 3.8 cm with unchanged mild surrounding edema. Intraventricular extension is again noted with similar appearance of moderately large volume hemorrhage in the left greater than right lateral ventricles and smaller volume hemorrhage in the third and fourth ventricles. Mild ventriculomegaly is unchanged as is confluent hypoattenuation in the periventricular white matter compatible with transependymal CSF flow. Rightward midline shift is unchanged measuring 7 mm. No acute large territory infarct is identified separate from the hemorrhage. There is no extra-axial fluid collection. Vascular: Calcified atherosclerosis at the skull base. Skull: No fracture or suspicious osseous lesion. Sinuses/Orbits: Mild mucosal thickening in the paranasal sinuses. Persistent opacification of the right mastoid air cells and right middle ear cavity. Unremarkable orbits. Other: None. IMPRESSION: Unchanged large left basal ganglia hemorrhage with intraventricular extension, mild ventriculomegaly and subependymal CSF flow, and 7 mm of rightward midline shift. Electronically Signed   By: Logan Bores M.D.   On: 10/21/2019 16:12    PHYSICAL EXAM   Temp:  [98.5 F (36.9 C)-102.2 F (39 C)] 100.7 F (38.2 C) (04/22 0800) Pulse Rate:  [76-104] 92 (04/22 1142) Resp:  [17-29] 26 (04/22 1142) BP: (123-187)/(46-107) 160/72 (04/22  1142) SpO2:  [94 %-100 %] 98 % (04/22 1142) FiO2 (%):  [40 %] 40 % (04/22 1142) Weight:  [102.2 kg] 102.2 kg (04/22 0453)  General - Well nourished, well developed, intubated off sedation.  Ophthalmologic - fundi not visualized due to noncooperation.  Cardiovascular - Regular rate and rhythm.  Neuro - intubated off propofol, eyes closed, not following commands. With forced eye opening, eyes in mid position, not blinking to visual threat, doll's eyes sluggish, not tracking, PERRL. Corneal reflex weak on the left, absent on the right, gag and cough present. Breathing  over the vent.  Facial symmetry not able to test due to ET tube.  Tongue protrusion outside of mouth with edema. On pain stimulation, withdraw at least 3+/5 LUE and 3/5LLE, but 2/5 RLE but 0/5 RUE. DTR 1+ and bilateral positive babinski. Sensation, coordination and gait not tested.   ASSESSMENT/PLAN Debra Huff is a 49 y.o. female with uncontrolled HTN presenting with R sided weakness, R facial droop, diaphoreses, leaning to the R and unresponsive. Found to be twitching all over w/ pinpoint pupils and SBP > 260.   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 R lateral ventricle entrapment w/ dilatation of atrium and temporal horn.   Repeat CT head 4/19 mild extension large L basal ganglia ICH. Stable IVH. Stable ventriculomegaly. 60mm midline shift. No new abnormality.  CT head 4/20 slightly smaller L basal ganglia and corona radiata ICH. Stable IVH. Stable 34mm midline shift. Stable dilation R temporal horn.    CTA head & neck No intracranial atherosclerosis. Atherosclerosis L ICA bifurcation and B cavernous ICAs.  CT head 4/21 unchanged hematoma and midline shift.  2D Echo EF 60-65%  LDL 84  HgbA1c 5.7  SCDs for VTE prophylaxis  No antithrombotic prior  to admission, now on No antithrombotic given hemorrhage   Therapy recommendations:  reconsult when stable for evaluation  Disposition:  Pending  ICH score = 3   Acute Respiratory Failure d/t ICH  Intubated in ED  CCM on board   On weaning  Off sedation  Likely will need trach if ongoing aggressive care  Cerebral Edema  NSG consulted [name]) no role for surgical intervention on admission. Following for possible EVD.  CT head 4/19 6->40mm midline shift   CT head 4/20 stable 26mm midline shift  CT head 4/21 stable 7 mm midline shift  On 3% @ 50/h  23.4% saline x 1  Goal Na 150-155  Na 139->137->141->143->147->149->156->159->156->157->158->158->158  Na Q6h  Hypertensive Emergency  Home meds:  None  SBP > 260 on arrival . SBP goal < 160  On IV Cleviprex  off labetalol gtt  Added norvasc 10, labetalol 200 tid->300 tid  Add losartan 50 bid  Add hydralazine 50 tid -> 100 tid . Long-term BP goal normotensive  Hyperlipidemia  LDL 84, goal LDL < 70   on no statin PTA  Add statin on discharge  Dysphagia . Secondary to stroke . NPO . On tube feedings @ 40cc w/ prostat . Speech on board   Other Stroke Risk Factors  UDS:  Amphetamines POSITIVE - cessation education will be provided  Obesity, Body mass index is 37.49 kg/m., recommend weight loss, diet and exercise as appropriate   Other Active Problems  Leukocytosis WBC 11.6->17.9->14.2->12.6  Polycythemia Hb 15.5->16.3->13.5->12.1->12.3, resolved  Hypokalemia 3.4->3.7->3.8 - supplemented - resolved  Hypophosphatemia 2.2->1.9->2.5->2.9  - supplemented - resolved  Urinary retention, foley placed  Constipation. Bowel regimen added.  Hospital day # 3  This patient is critically ill due to large ICH, IVH, SAH, ventriculomegaly, hypertensive emergency and at significant risk of neurological worsening, death form hematoma expansion, cerebral edema, brain herniation, seizure, hydrocephalus, heart  failure. This patient's care requires constant monitoring of vital signs, hemodynamics, respiratory and cardiac monitoring, review of multiple databases, neurological assessment, discussion with family, other specialists and medical decision making of high complexity. I spent 40 minutes of neurocritical care time in the care of this patient. I discussed with Dr. Marchelle Gearing.   Marvel Plan, MD PhD Stroke Neurology 10/22/2019 11:57 AM  To contact Stroke Continuity provider, please refer to [email or web address]. After hours, contact General Neurology

## 2019-10-22 NOTE — Progress Notes (Signed)
Patient ID: Debra Huff, female   DOB: March 19, 1971, 49 y.o.   MRN: [id number] Overall no significant neuro change.  Pupils 3-2 reactive right hemiparesis left side with nonpurposeful movements  CT stable ventriculomegaly stable nonprogressive does appear that some of the clot in the aqueduct and fourth is contracted with open CSF space around it.  Continue observation

## 2019-10-22 NOTE — Procedures (Deleted)
Extubation Procedure Note  Patient Details:   Name: XUAN MATEUS DOB: 1971/05/25 MRN: [id number]   Airway Documentation:    Vent end date: 10/22/19 Vent end time: 0825   Evaluation  O2 sats: stable throughout Complications: No apparent complications Patient did tolerate procedure well. Bilateral Breath Sounds: Rhonchi, Diminished   Yes   Patient extubated to 2L nasal cannula per MD order.  Positive cuff leak noted.  Patient able to speak post extubation.  No stridor noted.  Sats and vitals currently stable.    Elyn Peers 10/22/2019, 8:29 AM

## 2019-10-22 NOTE — Progress Notes (Signed)
NAME:  Debra Huff, MRN:  [id number], DOB:  Feb 21, 1971, LOS: 3 ADMISSION DATE:  11/15/19, CONSULTATION DATE:  10/22/19 REFERRING MD:  Otelia Limes, CHIEF COMPLAINT:  Hemorrhagic CVA   Brief History    Debra Huff is a 49 y.o. F with HTN for which she has not sought medical care or been taking medications for years per her sister.  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   Seen by neurology and neurosurgery and deemed not a surgical candidate.  Intubated and PCCM consulted for vent management.   Extremely hypertensive 269/136 and started on Cleviprex gtt.  Past Medical History  HTN  Significant Hospital Events   4/18 Admit to ICU, neurology primary  4/20 -= No acute events.  ON cleviprex and labetalol now to maintain SBP < 140. Remains on 3%. Tolerating PSV wean on 5/5 most of the day 4/19.  4/21 - unresponsive on vent. Family of husband, dad and 2 sons at bedside for goals of care with Dr Roda Shutters  Consults:  Neurosurgery  PCCM  Procedures:  4/19 ETT 4/19 R IJ CVC  Significant Diagnostic Tests:  10/22/19 CT head>> Large intraparenchymal hematoma centered in the left basal ganglia with subarachnoid extension and 6 mm rightward midlinesShift. Right lateral ventricle entrapment with dilatation of the atrium and temporal horn.  CTA head 4/19 > stable basal ganglia and corona radiata hemorrhage.  Stable IVH, stable 82mm MLS.  Stable dilatation of right temporal tip.  Micro Data:  10/22/19 Sars-Cov-2>>negative   Antimicrobials:    Interim history/subjective:   10/22/2019 - per dr Roda Shutters - does not move RUE but moves others. Still on cleviprex and 3% saline. On vent   Objective   Blood pressure (!) 161/67, pulse 86, temperature (!) 100.7 F (38.2 C), temperature source Axillary, resp. rate (!) 28, height 5\' 5"  (1.651 m), weight  102.2 kg, SpO2 99 %.    Vent Mode: PSV;CPAP FiO2 (%):  [40 %] 40 % Set Rate:  [18 bmp] 18 bmp Vt Set:  [450 mL] 450 mL PEEP:  [5 cmH20] 5 cmH20 Pressure Support:  [5 cmH20] 5 cmH20 Plateau Pressure:  [16 cmH20-20 cmH20] 16 cmH20   Intake/Output Summary (Last 24 hours) at 10/22/2019 1026 Last data filed at 10/22/2019 1016 Gross per 24 hour  Intake 2529.6 ml  Output 2800 ml  Net -270.4 ml   Filed Weights   2019/11/15 2321 10/21/19 0500 10/22/19 0453  Weight: 103.8 kg 101.1 kg 102.2 kg    Physical Exam: General Appearance:  Looks criticall ill OBESE - + Head:  Normocephalic, without obvious abnormality, atraumatic Eyes: , conjunctiva/corneas - muddy3     Ears:  Normal external ear canals, both ears Nose:  G tube - no Throat:  ETT TUBE - yes , OG tube - yes Neck:  Supple,  No enlargement/tenderness/nodules Lungs: Clear to auscultation bilaterally, Ventilator   Synchrony - yes on PSV Heart:  S1 and S2 normal, no murmur, CVP - no.  Pressors - no but on cleviprex Abdomen:  Soft, no masses, no organomegaly Genitalia / Rectal:  Not done Extremities:  Extremities- intact Skin:  ntact in exposed areas . Sacral area - x Neurologic:  Sedation - nne -> RASS - -5 .   No change from yesterday   Assessment & Plan:   Basal ganglia ICH - Not on AC at home.  Evaluated by neurosurgery and deemed to not be a surgical candidate.  10/22/2019 - RASS -4  plan - Cleviprex and labetalol to maintain SBP between 100-140 per neuro. - Labetalol PRN. - Continue hypertonic saline with frequent Na checks, goal Na 150 - 155. - Goals of care per neuro. - Neurosurgery following for possibility of EVD placement.  Respiratory failure - Pateint unable to protect airway and intubated in ED.   10/22/2019 - > does not meet criteria for/Extubation in setting of Acute Respiratory Failure due to stroke and unresponsivleness  Plan -  PRVC - Mental status will likely be major barrier to extubation. - Might  need trach if family wishes for continued aggressive care. - Follow CXR.   Rest per primary team.  Best practice:  Diet: NPO. Pain/Anxiety/Delirium protocol (if indicated): propofol gtt as needed (currently off). VAP protocol (if indicated): HOB at 30 degrees. DVT prophylaxis: No DVT prophylaxis indicated given ICH GI prophylaxis: Protonix. Glucose control: SSI if glucose consistently > 180. Mobility: NA. Code Status: Full code. Family Communication: per neur     ATTESTATION & SIGNATURE   The patient Debra Huff is critically ill with multiple organ systems failure and requires high complexity decision making for assessment and support, frequent evaluation and titration of therapies, application of advanced monitoring technologies and extensive interpretation of multiple databases.   Critical Care Time devoted to patient care services described in this note is  31  Minutes. This time reflects time of care of this signee Dr Kalman Shan. This critical care time does not reflect procedure time, or teaching time or supervisory time of PA/NP/Med student/Med Resident etc but could involve care discussion time     Dr. Kalman Shan, M.D., [facility].C.P Pulmonary and Critical Care Medicine Staff Physician [facility] Pager: [phone], If no answer or between  15:00h - 7:00h: call [phone]  10/22/2019 10:40 AM     LABS    PULMONARY Recent Labs  Lab 10-25-19 2326  TCO2 25    CBC Recent Labs  Lab 10/20/19 0245 10/21/19 0326 10/22/19 0810  HGB 13.5 12.1 12.3  HCT 42.3 39.2 40.5  WBC 17.9* 14.2* 12.6*  PLT 234 177 182    COAGULATION Recent Labs  Lab 10-25-2019 2319  INR 1.0    CARDIAC  No results for input(s): TROPONINI in the last 168 hours. No results for input(s): PROBNP in the last 168 hours.   CHEMISTRY Recent Labs  Lab October 25, 2019 2319 October 25, 2019 2319 25-Oct-2019 2326 10/19/19 0913 10/20/19 0245  10/20/19 0245 10/20/19 0914 10/20/19 1500 10/20/19 1625 10/20/19 2051 10/21/19 0326 10/21/19 0326 10/21/19 0944 10/21/19 1451 10/21/19 1722 10/21/19 2051 10/22/19 0250 10/22/19 0810  NA 139   < > 137   < > 148*  149*   < > 153*   < >  --    < > 159*   < > 156* 157*  --  158* 158* 158*  K 4.4   < > 3.8  --  3.4*   < >  --   --   --   --  3.7  --   --   --   --   --   --  3.8  CL 106  --  105  --  121*  --   --   --   --   --  >130*  --   --   --   --   --   --  127*  CO2 21*  --   --   --  21*  --   --   --   --   --  21*  --   --   --   --   --   --  23  GLUCOSE 104*  --  110*  --  159*  --   --   --   --   --  146*  --   --   --   --   --   --  110*  BUN 21*  --  24*  --  18  --   --   --   --   --  19  --   --   --   --   --   --  24*  CREATININE 0.98  --  1.00  --  1.03*  --   --   --   --   --  0.91  --   --   --   --   --   --  0.90  CALCIUM 9.2  --   --   --  8.4*  --   --   --   --   --  8.6*  --   --   --   --   --   --  8.6*  MG  --   --   --   --  2.1  --  1.9  --  2.0  --  2.0  --   --   --  2.0  --   --   --   PHOS  --   --   --   --  2.2*  --  1.9*  --  2.5  --  2.9  --   --   --  3.3  --   --   --    < > = values in this interval not displayed.   Estimated Creatinine Clearance: 89.6 mL/min (by C-G formula based on SCr of 0.9 mg/dL).   LIVER Recent Labs  Lab 10/04/2019 2319  AST 25  ALT 22  ALKPHOS 82  BILITOT 0.6  PROT 7.5  ALBUMIN 3.8  INR 1.0     INFECTIOUS Recent Labs  Lab 10/19/19 0913  LATICACIDVEN 1.9     ENDOCRINE CBG (last 3)  Recent Labs    10/21/19 2340 10/22/19 0337 10/22/19 0812  GLUCAP 105* 124* 107*         IMAGING x48h  - image(s) personally visualized  -   highlighted in bold CT HEAD WO CONTRAST  Result Date: 10/21/2019 CLINICAL DATA:  Stroke follow-up. Intracranial hemorrhage. EXAM: CT HEAD WITHOUT CONTRAST TECHNIQUE: Contiguous axial images were obtained from the base of the skull through the vertex without  intravenous contrast. COMPARISON:  10/20/2019 FINDINGS: Brain: A large parenchymal hemorrhage centered in the left basal ganglia has not significantly changed in size, measuring approximately 6.7 x 3.8 cm with unchanged mild surrounding edema. Intraventricular extension is again noted with similar appearance of moderately large volume hemorrhage in the left greater than right lateral ventricles and smaller volume hemorrhage in the third and fourth ventricles. Mild ventriculomegaly is unchanged as is confluent hypoattenuation in the periventricular white matter compatible with transependymal CSF flow. Rightward midline shift is unchanged measuring 7 mm. No acute large territory infarct is identified separate from the hemorrhage. There is no extra-axial fluid collection. Vascular: Calcified atherosclerosis at the skull base. Skull: No fracture  or suspicious osseous lesion. Sinuses/Orbits: Mild mucosal thickening in the paranasal sinuses. Persistent opacification of the right mastoid air cells and right middle ear cavity. Unremarkable orbits. Other: None. IMPRESSION: Unchanged large left basal ganglia hemorrhage with intraventricular extension, mild ventriculomegaly and subependymal CSF flow, and 7 mm of rightward midline shift. Electronically Signed   By: Logan Bores M.D.   On: 10/21/2019 16:12

## 2019-10-23 ENCOUNTER — Inpatient Hospital Stay (HOSPITAL_COMMUNITY): Payer: Self-pay

## 2019-10-23 DIAGNOSIS — N390 Urinary tract infection, site not specified: Secondary | ICD-10-CM

## 2019-10-23 DIAGNOSIS — R509 Fever, unspecified: Secondary | ICD-10-CM

## 2019-10-23 LAB — URINALYSIS, ROUTINE W REFLEX MICROSCOPIC
Bilirubin Urine: NEGATIVE
Glucose, UA: NEGATIVE mg/dL
Hgb urine dipstick: NEGATIVE
Ketones, ur: NEGATIVE mg/dL
Nitrite: NEGATIVE
Protein, ur: NEGATIVE mg/dL
Specific Gravity, Urine: 1.02 (ref 1.005–1.030)
pH: 5 (ref 5.0–8.0)

## 2019-10-23 LAB — GLUCOSE, CAPILLARY
Glucose-Capillary: 102 mg/dL — ABNORMAL HIGH (ref 70–99)
Glucose-Capillary: 114 mg/dL — ABNORMAL HIGH (ref 70–99)
Glucose-Capillary: 118 mg/dL — ABNORMAL HIGH (ref 70–99)
Glucose-Capillary: 132 mg/dL — ABNORMAL HIGH (ref 70–99)
Glucose-Capillary: 139 mg/dL — ABNORMAL HIGH (ref 70–99)
Glucose-Capillary: 144 mg/dL — ABNORMAL HIGH (ref 70–99)

## 2019-10-23 LAB — BASIC METABOLIC PANEL
Anion gap: 8 (ref 5–15)
BUN: 31 mg/dL — ABNORMAL HIGH (ref 6–20)
CO2: 22 mmol/L (ref 22–32)
Calcium: 8.5 mg/dL — ABNORMAL LOW (ref 8.9–10.3)
Chloride: 130 mmol/L — ABNORMAL HIGH (ref 98–111)
Creatinine, Ser: 0.85 mg/dL (ref 0.44–1.00)
GFR calc Af Amer: >60 mL/min (ref >60)
GFR calc non Af Amer: >60 mL/min (ref >60)
Glucose, Bld: 149 mg/dL — ABNORMAL HIGH (ref 70–99)
Potassium: 3.6 mmol/L (ref 3.5–5.1)
Sodium: 160 mmol/L — ABNORMAL HIGH (ref 135–145)

## 2019-10-23 LAB — PHOSPHORUS: Phosphorus: 3.4 mg/dL (ref 2.5–4.6)

## 2019-10-23 LAB — SODIUM
Sodium: 162 mmol/L (ref 135–145)
Sodium: 159 mmol/L — ABNORMAL HIGH (ref 135–145)
Sodium: 159 mmol/L — ABNORMAL HIGH (ref 135–145)
Sodium: 153 mmol/L — ABNORMAL HIGH (ref 135–145)

## 2019-10-23 LAB — MAGNESIUM: Magnesium: 2.4 mg/dL (ref 1.7–2.4)

## 2019-10-23 LAB — CBC
HCT: 41.5 % (ref 36.0–46.0)
Hemoglobin: 12.4 g/dL (ref 12.0–15.0)
MCH: 30 pg (ref 26.0–34.0)
MCHC: 29.9 g/dL — ABNORMAL LOW (ref 30.0–36.0)
MCV: 100.5 fL — ABNORMAL HIGH (ref 80.0–100.0)
Platelets: 172 10*3/uL (ref 150–400)
RBC: 4.13 MIL/uL (ref 3.87–5.11)
RDW: 16.7 % — ABNORMAL HIGH (ref 11.5–15.5)
WBC: 10.4 10*3/uL (ref 4.0–10.5)
nRBC: 0 % (ref 0.0–0.2)

## 2019-10-23 LAB — TRIGLYCERIDES: Triglycerides: 108 mg/dL (ref <150)

## 2019-10-23 LAB — PROCALCITONIN: Procalcitonin: <0.1 ng/mL

## 2019-10-23 MED ORDER — SODIUM CHLORIDE 0.9 % IV SOLN: INTRAVENOUS | Status: AC

## 2019-10-23 MED ORDER — SODIUM CHLORIDE 0.9 % IV SOLN
2.0000 g | INTRAVENOUS | Status: DC
  Administered 2019-10-23 – 2019-10-25 (×3): 2 g via INTRAVENOUS
  Filled 2019-10-23: qty 2
  Filled 2019-10-23: qty 20
  Filled 2019-10-23: qty 2

## 2019-10-23 NOTE — Progress Notes (Signed)
STROKE TEAM PROGRESS NOTE   INTERVAL HISTORY No family at bedside. Pt had fever overnight, found to have mild UTI. Foley catheter removed. Bladder scan with I & O PRN. Na 160 this am, 3% saline discontinued. On TF. BP elevated and put back on cleviprex. Pt seems less responsive of LUE and LLE as yesterday, will repeat CT head. Discussed with husband Debra Huff, they will have family meeting tomorrow at home.   Vitals:   10/23/19 0900 10/23/19 0930 10/23/19 1000 10/23/19 1006  BP:  (!) 160/68 (!) 176/74 (!) 153/70  Pulse: 85 91 91 88  Resp: (!) 22 (!) 24 20 (!) 22  Temp:      TempSrc:      SpO2: 98% 98% 99% 98%  Weight:      Height:        CBC:  Recent Labs  Lab 12-Nov-2019 2319 2019-11-12 2326 10/22/19 0810 10/23/19 0604  WBC 11.6*   < > 12.6* 10.4  NEUTROABS 5.5  --   --   --   HGB 15.5*   < > 12.3 12.4  HCT 48.9*   < > 40.5 41.5  MCV 97.8   < > 100.7* 100.5*  PLT 235   < > 182 172   < > = values in this interval not displayed.    Basic Metabolic Panel:  Recent Labs  Lab 10/21/19 1722 10/21/19 2051 10/22/19 0810 10/22/19 1451 10/23/19 0604 10/23/19 0822  NA  --    < > 158*   < > 160* 159*  K  --   --  3.8  --  3.6  --   CL  --   --  127*  --  130*  --   CO2  --   --  23  --  22  --   GLUCOSE  --   --  110*  --  149*  --   BUN  --   --  24*  --  31*  --   CREATININE  --   --  0.90  --  0.85  --   CALCIUM  --   --  8.6*  --  8.5*  --   MG 2.0  --   --   --  2.4  --   PHOS 3.3  --   --   --  3.4  --    < > = values in this interval not displayed.   Lipid Panel:     Component Value Date/Time   CHOL 152 10/20/2019 0245   TRIG 108 10/23/2019 0604   HDL 46 10/20/2019 0245   CHOLHDL 3.3 10/20/2019 0245   VLDL 22 10/20/2019 0245   LDLCALC 84 10/20/2019 0245   HgbA1c:  Lab Results  Component Value Date   HGBA1C 5.7 (H) 10/20/2019   Urine Drug Screen:     Component Value Date/Time   LABOPIA NONE DETECTED 10/19/2019 0600   COCAINSCRNUR NONE DETECTED 10/19/2019 0600    LABBENZ NONE DETECTED 10/19/2019 0600   AMPHETMU POSITIVE (A) 10/19/2019 0600   THCU NONE DETECTED 10/19/2019 0600   LABBARB NONE DETECTED 10/19/2019 0600    Alcohol Level     Component Value Date/Time   ETH <10 10/19/2019 0057    IMAGING past 24 hours DG CHEST PORT 1 VIEW  Result Date: 10/23/2019 CLINICAL DATA:  49 year old female intubated. Code stroke presentation on Nov 12, 2019 with intracranial hemorrhage. EXAM: PORTABLE CHEST 1 VIEW COMPARISON:  Portable chest 10/20/2019 and earlier. FINDINGS: Portable AP  semi upright view at 0547 hours. Intubated, ET tube tip in good position between the level the clavicles and carina. Enteric tube courses into the stomach, tip not included. Stable right IJ central line. Stable cardiac size at the upper limits of normal. Other mediastinal contours are within normal limits. Streaky retrocardiac opacity is stable compatible with subsegmental lower lobe atelectasis. Elsewhere when Allowing for portable technique the lungs are clear. Paucity of bowel gas. No acute osseous abnormality identified. IMPRESSION: 1.  Stable lines and tubes. 2. Stable left lower lobe atelectasis. No new cardiopulmonary abnormality. Electronically Signed   By: Odessa Fleming M.D.   On: 10/23/2019 07:50    PHYSICAL EXAM   Temp:  [99.6 F (37.6 C)-102.4 F (39.1 C)] 99.6 F (37.6 C) (04/23 0800) Pulse Rate:  [74-92] 88 (04/23 1006) Resp:  [20-32] 22 (04/23 1006) BP: (125-176)/(52-81) 153/70 (04/23 1006) SpO2:  [97 %-100 %] 98 % (04/23 1006) FiO2 (%):  [40 %] 40 % (04/23 0853) Weight:  [102 kg] 102 kg (04/23 0500)  General - Well nourished, well developed, intubated off sedation.  Ophthalmologic - fundi not visualized due to noncooperation.  Cardiovascular - Regular rate and rhythm.  Neuro - intubated off propofol, eyes closed, not following commands. With forced eye opening, eyes in mid position, not blinking to visual threat, doll's eyes sluggish, not tracking, PERRL.  Corneal reflex weak on the left, absent on the right, gag and cough present. Breathing  over the vent.  Facial symmetry not able to test due to ET tube.  Tongue protrusion outside of mouth with edema. On pain stimulation, withdraw at least 3+/5 LUE and 3/5LLE, but 2/5 RLE but 0/5 RUE. DTR 1+ and bilateral positive babinski. Sensation, coordination and gait not tested.   ASSESSMENT/PLAN Debra Huff is a 49 y.o. female with uncontrolled HTN presenting with R sided weakness, R facial droop, diaphoreses, leaning to the R and unresponsive. Found to be twitching all over w/ pinpoint pupils and SBP > 260.   Stroke:  R large basal ganglia ICH w/ IVH, SAH and associated cerebral edema w/ subfalcine herniation, hemorrhage secondary to hypertension   CT head large L basal ganglia ICH w/ SAH and 20mm R midline shift. R lateral ventricle entrapment w/ dilatation of atrium and temporal horn.   Repeat CT head 4/19 mild extension large L basal ganglia ICH. Stable IVH. Stable ventriculomegaly. 21mm midline shift. No new abnormality.  CT head 4/20 slightly smaller L basal ganglia and corona radiata ICH. Stable IVH. Stable 46mm midline shift. Stable dilation R temporal horn.    CTA head & neck No intracranial atherosclerosis. Atherosclerosis L ICA bifurcation and B cavernous ICAs.  CT head 4/21 unchanged hematoma and midline shift.  CT head 4/23 pending  2D Echo EF 60-65%  LDL 84  HgbA1c 5.7  SCDs for VTE prophylaxis  No antithrombotic prior to admission, now on No antithrombotic given hemorrhage   Therapy recommendations:  reconsult when stable for evaluation  Disposition:  Pending  ICH score = 3   Long-term neuro prognosis poor. Family meeting at pts father's house Saturday at 1p to discuss GOC. They plan to come to the hospital Sun 9-10a. They will call with decision if made after the meeting.  Acute Respiratory Failure d/t ICH  Intubated in ED  CCM on board   On vent  Off  sedation  Likely will need trach if ongoing aggressive care  Fever, Leukocytosis   Tmax 102.4->102.3->102.3->99.6  WBC 11.6->17.9->14.2->12.6->10.4  UA w/  bacteria, WBC 21-50  Check UCx pending   CXR stable 4/23  Tylenol and ice packs  Resp Cx pending   Blood Cx 4/24 pending    Rocephin 4/23>>  Repeat CXR in am  Cerebral Edema  NSG consulted Debra Huff) no role for surgical intervention on admission. Following for possible EVD.  CT head 4/19 6->57mm midline shift   CT head 4/20 stable 52mm midline shift  CT head 4/21 stable 7 mm midline shift  CT head 4/23 pending  On 3% @ 50/h -> NS @ 50 -> off   23.4% saline x 1  Goal Na 150-155  Na 139->...->162->160->159  Na Q6h  Hypertensive Emergency  Home meds:  None  SBP > 260 on arrival . SBP goal < 160  On IV Cleviprex  off labetalol gtt  Added norvasc 10, labetalol 200 tid->300 tid  Add losartan 50 bid  Add hydralazine 50 tid -> 100 tid . Long-term BP goal normotensive  Hyperlipidemia  LDL 84, goal LDL < 70   on no statin PTA  Add statin on discharge  Dysphagia . Secondary to stroke . NPO . On tube feedings @ 40cc w/ prostat . Speech on board   Other Stroke Risk Factors  UDS:  Amphetamines POSITIVE - cessation education will be provided  Obesity, Body mass index is 37.42 kg/m., recommend weight loss, diet and exercise as appropriate   Other Active Problems  Polycythemia Hb 15.5->16.3->13.5->12.1->12.3->12.4, resolved  Hypokalemia 3.4->3.7->3.8->3.6 - supplemented - resolved  Hypophosphatemia 2.2->1.9->2.5->2.9  - supplemented - resolved  Urinary retention, foley placed  Constipation. Bowel regimen added.  Hospital day # 4  This patient is critically ill due to large ICH, IVH, SAH, ventriculomegaly, hypertensive emergency and at significant risk of neurological worsening, death form hematoma expansion, cerebral edema, brain herniation, seizure, hydrocephalus, heart failure. This  patient's care requires constant monitoring of vital signs, hemodynamics, respiratory and cardiac monitoring, review of multiple databases, neurological assessment, discussion with family, other specialists and medical decision making of high complexity. I spent 40 minutes of neurocritical care time in the care of this patient. I had long discussion with husband Debra Huff over the phone, updated pt current condition, treatment plan and potential prognosis, and answered all the questions. He expressed understanding and appreciation. As per Debra Huff, Family meeting at pts father's house Saturday at 1p to discuss [place]. They plan to come to the hospital Sun 9-10a. They will call with decision if made after the meeting.   Debra Hawking, MD PhD Stroke Neurology 10/23/2019 11:39 AM  To contact Stroke Continuity provider, please refer to [email or web address] After hours, contact General Neurology

## 2019-10-23 NOTE — Progress Notes (Signed)
NAME:  NETHA DAFOE, MRN:  [id number], DOB:  1971-02-21, LOS: 4 ADMISSION DATE:  10/21/2019, CONSULTATION DATE:  10/23/19 REFERRING MD:  Otelia Limes, CHIEF COMPLAINT:  Hemorrhagic CVA   Brief History    Verdean Murin is a 49 y.o. F with HTN for which she has not sought medical care or been taking medications for years per her sister.  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   Seen by neurology and neurosurgery and deemed not a surgical candidate.  Intubated and PCCM consulted for vent management.   Extremely hypertensive 269/136 and started on Cleviprex gtt.  Past Medical History  HTN  Significant Hospital Events   4/18 Admit to ICU, neurology primary  4/20 -= No acute events.  ON cleviprex and labetalol now to maintain SBP < 140. Remains on 3%. Tolerating PSV wean on 5/5 most of the day 4/19.  4/21 - unresponsive on vent. Family of husband, dad and 2 sons at bedside for goals of care with Dr Roda Shutters  4/22 - per dr Roda Shutters - does not move RUE but moves others. Still on cleviprex and 3% saline. On vent   Consults:  Neurosurgery  PCCM  Procedures:  4/19 ETT 4/19 R IJ CVC  Significant Diagnostic Tests:  10/23/19 CT head>> Large intraparenchymal hematoma centered in the left basal ganglia with subarachnoid extension and 6 mm rightward midlinesShift. Right lateral ventricle entrapment with dilatation of the atrium and temporal horn.  CTA head 4/19 > stable basal ganglia and corona radiata hemorrhage.  Stable IVH, stable 55mm MLS.  Stable dilatation of right temporal tip.  Micro Data:  10/23/19 Sars-Cov-2>>negative  4/19 - MRS PCR neg Xx 4/23 - blood 4/23 - urine 4/23 - trach   Antimicrobials:  4/23 ceftriaxone >>   Interim history/subjective:   10/23/2019 - Na 162 and neuro holding 3% saline. Remains on cleviprex and vent. Not  on sedation gtt. Did have fever 102.56F but wbc down. UA dwith pyuria and bacteriuria  Objective   Blood pressure (!) 176/74, pulse 91, temperature 99.6 F (37.6 C), temperature source Axillary, resp. rate 20, height 5\' 5"  (1.651 m), weight 102 kg, SpO2 99 %.    Vent Mode: CPAP;PSV FiO2 (%):  [40 %] 40 % Set Rate:  [18 bmp] 18 bmp Vt Set:  [450 mL] 450 mL PEEP:  [5 cmH20] 5 cmH20 Pressure Support:  [5 cmH20-10 cmH20] 10 cmH20 Plateau Pressure:  [15 cmH20-19 cmH20] 19 cmH20   Intake/Output Summary (Last 24 hours) at 10/23/2019 1008 Last data filed at 10/23/2019 1000 Gross per 24 hour  Intake 2532.38 ml  Output 2550 ml  Net -17.62 ml   Filed Weights   10/21/19 0500 10/22/19 0453 10/23/19 0500  Weight: 101.1 kg 102.2 kg 102 kg      Physical Exam: General Appearance:  Looks criticall ill OBESE - + Head:  Normocephalic, without obvious abnormality, atraumatic Eyes:  PERRL - x, conjunctiva/corneas - muddy     Ears:  Normal external ear canals, both ears Nose:  G tube - no Throat:  ETT TUBE - yes , OG tube - yes Neck:  Supple,  No enlargement/tenderness/nodules Lungs: Clear to auscultation bilaterally, Ventilator   Synchrony - yes. Doing PSV Heart:  S1 and S2 normal, no murmur, CVP - no.  Pressors - no Abdomen:  Soft, no masses, no organomegaly Genitalia / Rectal:  Not done Extremities:  Extremities- intact Skin:  ntact in exposed areas . Sacral area - not examined Neurologic:  Sedation - none -> RASS - -4      Assessment & Plan:   Basal ganglia ICH - Not on AC at home.  Evaluated by neurosurgery and deemed to not be a surgical candidate.  10/23/2019 - RASS -4 without sedation  plan - Cleviprex and labetalol to maintain SBP between 100-140 per neuro. - Labetalol PRN. - Continue hypertonic saline with frequent Na checks, goal Na 150 - 155; neuro maanging - Goals of care per neuro. - Neurosurgery following for possibility of EVD placement.  Respiratory failure -  Pateint unable to protect airway and intubated in ED.   10/23/2019 - > does not meet criteria for/Extubation in setting of Acute Respiratory Failure due to stroke and unresponsivleness  Plan -  PRVC - Mental status will likely be major barrier to extubation. - Might need trach if family wishes for continued aggressive care. - Follow CXR.   SIRS/Concern for sepsis  10/23/2019 febrile with abnormal UA  Plan  - check trach aspirate - chec urine culture  -await blood culture  -start empiric ceftriaxone    Rest per primary team.  Best practice:  Diet: NPO. Pain/Anxiety/Delirium protocol (if indicated): propofol gtt as needed (currently off). VAP protocol (if indicated): HOB at 30 degrees. DVT prophylaxis: No DVT prophylaxis indicated given ICH GI prophylaxis: Protonix. Glucose control: SSI if glucose consistently > 180. Mobility: NA. Code Status: Full code. Family Communication: per neur     ATTESTATION & SIGNATURE   The patient JANY BUCKWALTER is critically ill with multiple organ systems failure and requires high complexity decision making for assessment and support, frequent evaluation and titration of therapies, application of advanced monitoring technologies and extensive interpretation of multiple databases.   Critical Care Time devoted to patient care services described in this note is  31  Minutes. This time reflects time of care of this signee Dr Brand Males. This critical care time does not reflect procedure time, or teaching time or supervisory time of PA/NP/Med student/Med Resident etc but could involve care discussion time     Dr. Brand Males, M.D., [facility].C.P Pulmonary and Critical Care Medicine Staff Physician [facility] Pager: [phone], If no answer or between  15:00h - 7:00h: call [phone]  10/23/2019 10:08 AM     LABS    PULMONARY Recent Labs  Lab 10/28/19 2326  TCO2 25     CBC Recent Labs  Lab 10/21/19 0326 10/22/19 0810 10/23/19 0604  HGB 12.1 12.3 12.4  HCT 39.2 40.5 41.5  WBC 14.2* 12.6* 10.4  PLT 177 182 172    COAGULATION Recent Labs  Lab 2019-10-28 2319  INR 1.0    CARDIAC  No results for input(s): TROPONINI in the last 168 hours. No results for input(s): PROBNP in the last 168 hours.   CHEMISTRY Recent Labs  Lab 10/28/2019 2319 10-28-2019 2319 2019-10-28 2326 10/19/19 0913 10/20/19 0245 10/20/19 0245 10/20/19 0914 10/20/19 1500 10/20/19 1625 10/20/19 2051 10/21/19 0326 10/21/19 0944 10/21/19 1722 10/21/19 2051 10/22/19 0810 10/22/19 0810 10/22/19 1451 10/22/19 2051 10/23/19 0358 10/23/19 0604 10/23/19 0822  NA 139   < > 137   < > 148*   149*   < > 153*   < >  --    < > 159*   < >  --    < >  158*   < > 157* 152* 162* 160* 159*  K 4.4   < > 3.8  --  3.4*   < >  --   --   --   --  3.7  --   --   --  3.8  --   --   --   --  3.6  --   CL 106   < > 105  --  121*  --   --   --   --   --  >130*  --   --   --  127*  --   --   --   --  130*  --   CO2 21*  --   --   --  21*  --   --   --   --   --  21*  --   --   --  23  --   --   --   --  22  --   GLUCOSE 104*   < > 110*  --  159*  --   --   --   --   --  146*  --   --   --  110*  --   --   --   --  149*  --   BUN 21*   < > 24*  --  18  --   --   --   --   --  19  --   --   --  24*  --   --   --   --  31*  --   CREATININE 0.98   < > 1.00  --  1.03*  --   --   --   --   --  0.91  --   --   --  0.90  --   --   --   --  0.85  --   CALCIUM 9.2  --   --   --  8.4*  --   --   --   --   --  8.6*  --   --   --  8.6*  --   --   --   --  8.5*  --   MG  --   --   --   --  2.1   < > 1.9  --  2.0  --  2.0  --  2.0  --   --   --   --   --   --  2.4  --   PHOS  --   --   --   --  2.2*   < > 1.9*  --  2.5  --  2.9  --  3.3  --   --   --   --   --   --  3.4  --    < > = values in this interval not displayed.   Estimated Creatinine Clearance: 94.8 mL/min (by C-G formula based on SCr of 0.85  mg/dL).   LIVER Recent Labs  Lab 10/09/2019 2319  AST 25  ALT 22  ALKPHOS 82  BILITOT 0.6  PROT 7.5  ALBUMIN 3.8  INR 1.0     INFECTIOUS Recent Labs  Lab 10/19/19 0913  LATICACIDVEN 1.9     ENDOCRINE CBG (last 3)  Recent Labs    10/22/19 2315 10/23/19 0327 10/23/19 0810  GLUCAP 136* 118* 139*  IMAGING x48h  - image(s) personally visualized  -   highlighted in bold CT HEAD WO CONTRAST  Result Date: 10/21/2019 CLINICAL DATA:  Stroke follow-up. Intracranial hemorrhage. EXAM: CT HEAD WITHOUT CONTRAST TECHNIQUE: Contiguous axial images were obtained from the base of the skull through the vertex without intravenous contrast. COMPARISON:  10/20/2019 FINDINGS: Brain: A large parenchymal hemorrhage centered in the left basal ganglia has not significantly changed in size, measuring approximately 6.7 x 3.8 cm with unchanged mild surrounding edema. Intraventricular extension is again noted with similar appearance of moderately large volume hemorrhage in the left greater than right lateral ventricles and smaller volume hemorrhage in the third and fourth ventricles. Mild ventriculomegaly is unchanged as is confluent hypoattenuation in the periventricular white matter compatible with transependymal CSF flow. Rightward midline shift is unchanged measuring 7 mm. No acute large territory infarct is identified separate from the hemorrhage. There is no extra-axial fluid collection. Vascular: Calcified atherosclerosis at the skull base. Skull: No fracture or suspicious osseous lesion. Sinuses/Orbits: Mild mucosal thickening in the paranasal sinuses. Persistent opacification of the right mastoid air cells and right middle ear cavity. Unremarkable orbits. Other: None. IMPRESSION: Unchanged large left basal ganglia hemorrhage with intraventricular extension, mild ventriculomegaly and subependymal CSF flow, and 7 mm of rightward midline shift. Electronically Signed   By: Sebastian Ache M.D.    On: 10/21/2019 16:12   DG CHEST PORT 1 VIEW  Result Date: 10/23/2019 CLINICAL DATA:  49 year old female intubated. Code stroke presentation on 10/12/2019 with intracranial hemorrhage. EXAM: PORTABLE CHEST 1 VIEW COMPARISON:  Portable chest 10/20/2019 and earlier. FINDINGS: Portable AP semi upright view at 0547 hours. Intubated, ET tube tip in good position between the level the clavicles and carina. Enteric tube courses into the stomach, tip not included. Stable right IJ central line. Stable cardiac size at the upper limits of normal. Other mediastinal contours are within normal limits. Streaky retrocardiac opacity is stable compatible with subsegmental lower lobe atelectasis. Elsewhere when Allowing for portable technique the lungs are clear. Paucity of bowel gas. No acute osseous abnormality identified. IMPRESSION: 1.  Stable lines and tubes. 2. Stable left lower lobe atelectasis. No new cardiopulmonary abnormality. Electronically Signed   By: Odessa Fleming M.D.   On: 10/23/2019 07:50

## 2019-10-23 NOTE — Progress Notes (Signed)
Subjective: Patient intubated and sedated still  Objective: Vital signs in last 24 hours: Temp:  [100.5 F (38.1 C)-102.4 F (39.1 C)] 102.3 F (39.1 C) (04/23 0400) Pulse Rate:  [74-95] 84 (04/23 0700) Resp:  [20-32] 21 (04/23 0700) BP: (125-187)/(52-96) 165/76 (04/23 0700) SpO2:  [95 %-100 %] 99 % (04/23 0700) FiO2 (%):  [40 %] 40 % (04/23 0336) Weight:  [102 kg] 102 kg (04/23 0500)  Intake/Output from previous day: 04/22 0701 - 04/23 0700 In: 2233.2 [I.V.:1433.2; NG/GT:800] Out: 2550 [Urine:2550] Intake/Output this shift: Total I/O In: 293.8 [I.V.:167.8; NG/GT:126] Out: -   No change in neurologic function, still withdraws to noxious stimuli in left side, right hemiparesis.   Lab Results: Lab Results  Component Value Date   WBC 10.4 10/23/2019   HGB 12.4 10/23/2019   HCT 41.5 10/23/2019   MCV 100.5 (H) 10/23/2019   PLT 172 10/23/2019   Lab Results  Component Value Date   INR 1.0 10/05/2019   BMET Lab Results  Component Value Date   NA 160 (H) 10/23/2019   K 3.6 10/23/2019   CL 130 (H) 10/23/2019   CO2 22 10/23/2019   GLUCOSE 149 (H) 10/23/2019   BUN 31 (H) 10/23/2019   CREATININE 0.85 10/23/2019   CALCIUM 8.5 (L) 10/23/2019    Studies/Results: CT HEAD WO CONTRAST  Result Date: 10/21/2019 CLINICAL DATA:  Stroke follow-up. Intracranial hemorrhage. EXAM: CT HEAD WITHOUT CONTRAST TECHNIQUE: Contiguous axial images were obtained from the base of the skull through the vertex without intravenous contrast. COMPARISON:  10/20/2019 FINDINGS: Brain: A large parenchymal hemorrhage centered in the left basal ganglia has not significantly changed in size, measuring approximately 6.7 x 3.8 cm with unchanged mild surrounding edema. Intraventricular extension is again noted with similar appearance of moderately large volume hemorrhage in the left greater than right lateral ventricles and smaller volume hemorrhage in the third and fourth ventricles. Mild ventriculomegaly is  unchanged as is confluent hypoattenuation in the periventricular white matter compatible with transependymal CSF flow. Rightward midline shift is unchanged measuring 7 mm. No acute large territory infarct is identified separate from the hemorrhage. There is no extra-axial fluid collection. Vascular: Calcified atherosclerosis at the skull base. Skull: No fracture or suspicious osseous lesion. Sinuses/Orbits: Mild mucosal thickening in the paranasal sinuses. Persistent opacification of the right mastoid air cells and right middle ear cavity. Unremarkable orbits. Other: None. IMPRESSION: Unchanged large left basal ganglia hemorrhage with intraventricular extension, mild ventriculomegaly and subependymal CSF flow, and 7 mm of rightward midline shift. Electronically Signed   By: Sebastian Ache M.D.   On: 10/21/2019 16:12    Assessment/Plan: No change in neurologic function. CT head stable yesterday. No indication for EVD at this point. Will continue to monitor.    LOS: 4 days    Tiana Loft [facility] 10/23/2019, 7:49 AM

## 2019-10-23 NOTE — Progress Notes (Signed)
Na is 162, elevated above goal.   Also with fever of 102.3.  A/R: 49 year old female with large right basal ganglia ICH with IVH and associated mass effect -- Fever: May be a central neurogenic fever. Infectious etiology also possible. CXR, CBC, urinalysis and blood culture x 2 have been ordered. Administering another dose of Tylenol.  -- Na of 162, above goal of hypertonic saline protocol: Holding hypertonic saline for 6 hours. Will start a 6 hour infusion of 50 cc/hr NS.   Electronically signed: Dr. Caryl Pina

## 2019-10-24 ENCOUNTER — Inpatient Hospital Stay (HOSPITAL_COMMUNITY): Payer: Self-pay

## 2019-10-24 LAB — BLOOD CULTURE ID PANEL (REFLEXED)

## 2019-10-24 LAB — PROCALCITONIN: Procalcitonin: <0.1 ng/mL

## 2019-10-24 LAB — BASIC METABOLIC PANEL
Anion gap: 7 (ref 5–15)
BUN: 38 mg/dL — ABNORMAL HIGH (ref 6–20)
CO2: 24 mmol/L (ref 22–32)
Calcium: 8.7 mg/dL — ABNORMAL LOW (ref 8.9–10.3)
Chloride: 128 mmol/L — ABNORMAL HIGH (ref 98–111)
Creatinine, Ser: 0.89 mg/dL (ref 0.44–1.00)
GFR calc Af Amer: >60 mL/min (ref >60)
GFR calc non Af Amer: >60 mL/min (ref >60)
Glucose, Bld: 134 mg/dL — ABNORMAL HIGH (ref 70–99)
Potassium: 3.6 mmol/L (ref 3.5–5.1)
Sodium: 159 mmol/L — ABNORMAL HIGH (ref 135–145)

## 2019-10-24 LAB — SODIUM
Sodium: 160 mmol/L — ABNORMAL HIGH (ref 135–145)
Sodium: 159 mmol/L — ABNORMAL HIGH (ref 135–145)
Sodium: 158 mmol/L — ABNORMAL HIGH (ref 135–145)
Sodium: 160 mmol/L — ABNORMAL HIGH (ref 135–145)

## 2019-10-24 LAB — CBC
HCT: 43.2 % (ref 36.0–46.0)
Hemoglobin: 12.9 g/dL (ref 12.0–15.0)
MCH: 30 pg (ref 26.0–34.0)
MCHC: 29.9 g/dL — ABNORMAL LOW (ref 30.0–36.0)
MCV: 100.5 fL — ABNORMAL HIGH (ref 80.0–100.0)
Platelets: 163 10*3/uL (ref 150–400)
RBC: 4.3 MIL/uL (ref 3.87–5.11)
RDW: 16.7 % — ABNORMAL HIGH (ref 11.5–15.5)
WBC: 11 10*3/uL — ABNORMAL HIGH (ref 4.0–10.5)
nRBC: 0 % (ref 0.0–0.2)

## 2019-10-24 LAB — GLUCOSE, CAPILLARY
Glucose-Capillary: 126 mg/dL — ABNORMAL HIGH (ref 70–99)
Glucose-Capillary: 122 mg/dL — ABNORMAL HIGH (ref 70–99)
Glucose-Capillary: 130 mg/dL — ABNORMAL HIGH (ref 70–99)
Glucose-Capillary: 124 mg/dL — ABNORMAL HIGH (ref 70–99)
Glucose-Capillary: 118 mg/dL — ABNORMAL HIGH (ref 70–99)
Glucose-Capillary: 127 mg/dL — ABNORMAL HIGH (ref 70–99)

## 2019-10-24 LAB — MAGNESIUM: Magnesium: 2.4 mg/dL (ref 1.7–2.4)

## 2019-10-24 LAB — PHOSPHORUS: Phosphorus: 3.7 mg/dL (ref 2.5–4.6)

## 2019-10-24 MED ORDER — LABETALOL HCL 100 MG PO TABS
400.0000 mg | ORAL_TABLET | Freq: Three times a day (TID) | ORAL | Status: DC
  Administered 2019-10-24 – 2019-10-25 (×6): 400 mg
  Filled 2019-10-24 (×6): qty 4

## 2019-10-24 NOTE — Progress Notes (Signed)
PHARMACY - PHYSICIAN COMMUNICATION CRITICAL VALUE ALERT - BLOOD CULTURE IDENTIFICATION (BCID)  Debra Huff is an 49 y.o. female who presented to [facility] on 10/02/2019 with a chief complaint of ICH  Assessment:  Temp 99.6, WBC 11  Name of physician (or Provider) Contacted: Dr Wilford Corner (Neurology)  Current antibiotics: Ceftriaxone   Changes to prescribed antibiotics recommended:  None  Results for orders placed or performed during the hospital encounter of 10/13/2019  Blood Culture ID Panel (Reflexed) (Collected: 10/23/2019  8:40 AM)  Result Value Ref Range   Enterococcus species NOT DETECTED NOT DETECTED   Listeria monocytogenes NOT DETECTED NOT DETECTED   Staphylococcus species DETECTED (A) NOT DETECTED   Staphylococcus aureus (BCID) NOT DETECTED NOT DETECTED   Methicillin resistance NOT DETECTED NOT DETECTED   Streptococcus species NOT DETECTED NOT DETECTED   Streptococcus agalactiae NOT DETECTED NOT DETECTED   Streptococcus pneumoniae NOT DETECTED NOT DETECTED   Streptococcus pyogenes NOT DETECTED NOT DETECTED   Acinetobacter baumannii NOT DETECTED NOT DETECTED   Enterobacteriaceae species NOT DETECTED NOT DETECTED   Enterobacter cloacae complex NOT DETECTED NOT DETECTED   Escherichia coli NOT DETECTED NOT DETECTED   Klebsiella oxytoca NOT DETECTED NOT DETECTED   Klebsiella pneumoniae NOT DETECTED NOT DETECTED   Proteus species NOT DETECTED NOT DETECTED   Serratia marcescens NOT DETECTED NOT DETECTED   Haemophilus influenzae NOT DETECTED NOT DETECTED   Neisseria meningitidis NOT DETECTED NOT DETECTED   Pseudomonas aeruginosa NOT DETECTED NOT DETECTED   Candida albicans NOT DETECTED NOT DETECTED   Candida glabrata NOT DETECTED NOT DETECTED   Candida krusei NOT DETECTED NOT DETECTED   Candida parapsilosis NOT DETECTED NOT DETECTED   Candida tropicalis NOT DETECTED NOT DETECTED    Abran Duke 10/24/2019  7:04 AM

## 2019-10-24 NOTE — Progress Notes (Signed)
eLink Physician-Brief Progress Note Patient Name: Debra Huff DOB: 03/15/1971 MRN: [id number]   Date of Service  10/24/2019  HPI/Events of Note  Diarrhea. RN requests fecal management system.   eICU Interventions  Order entered.     Intervention Category Minor Interventions: Routine modifications to care plan (e.g. PRN medications for pain, fever)  Marveen Reeks Cotina Freedman 10/24/2019, 10:55 PM

## 2019-10-24 NOTE — Progress Notes (Signed)
NAME:  Debra Huff, MRN:  [id number], DOB:  Jan 24, 1971, LOS: 5 ADMISSION DATE:  10/17/2019, CONSULTATION DATE:  10/24/19 REFERRING MD:  Otelia Limes, CHIEF COMPLAINT:  Hemorrhagic CVA   Brief History    Debra Huff is a 49 y.o. F with HTN for which she has not sought medical care or been taking medications for years per her sister.  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   Seen by neurology and neurosurgery and deemed not a surgical candidate.  Intubated and PCCM consulted for vent management.   Extremely hypertensive 269/136 and started on Cleviprex gtt.  Past Medical History  HTN  Significant Hospital Events   4/18 Admit to ICU, neurology primary  4/20 -= No acute events.  ON cleviprex and labetalol now to maintain SBP < 140. Remains on 3%. Tolerating PSV wean on 5/5 most of the day 4/19.  4/21 - unresponsive on vent. Family of husband, dad and 2 sons at bedside for goals of care with Dr Roda Shutters  4/22 - per dr Roda Shutters - does not move RUE but moves others. Still on cleviprex and 3% saline. On vent   423 =  Na 162 and neuro holding 3% saline. Remains on cleviprex and vent. Not on sedation gtt. Did have fever 102.11F but wbc down. UA dwith pyuria and bacteriuria    Consults:  Neurosurgery  PCCM  Procedures:  4/19 ETT 4/19 R IJ CVC  Significant Diagnostic Tests:  10/19/19 - U tox - amphetamines +   10/24/19 CT head>> Large intraparenchymal hematoma centered in the left basal ganglia with subarachnoid extension and 6 mm rightward midlinesShift. Right lateral ventricle entrapment with dilatation of the atrium and temporal horn.   CTA head 4/19 > stable basal ganglia and corona radiata hemorrhage.  Stable IVH, stable 56mm MLS.  Stable dilatation of right temporal tip.  Micro Data:  10/24/19 Sars-Cov-2>>negative  4/19 - MRS PCR  neg Xx 4/23 - blood = BCID staph species 4/23 - urine - 100K GNR 4/23 - trach   Antimicrobials:  4/23 ceftriaxone >>   Interim history/subjective:   10/24/2019 - still febrile. Urine culture with GNR. On vent. On 3% saline - Na 159. On vent without sedation, 70% fio2, peep 5-> pulse ox 97%.  CT head without change - >  25mm Left to right shift and Large Left basal ganglian Hge with IV extension. Ventriculomegaly +   Objective   Blood pressure 140/69, pulse 79, temperature 100.2 F (37.9 C), temperature source Axillary, resp. rate (!) 25, height 5\' 5"  (1.651 m), weight 102 kg, SpO2 98 %.    Vent Mode: PRVC FiO2 (%):  [40 %-100 %] 70 % Set Rate:  [18 bmp] 18 bmp Vt Set:  [450 mL] 450 mL PEEP:  [5 cmH20] 5 cmH20 Pressure Support:  [10 cmH20] 10 cmH20 Plateau Pressure:  [12 cmH20-26 cmH20] 15 cmH20   Intake/Output Summary (Last 24 hours) at 10/24/2019 1239 Last data filed at 10/24/2019 0800 Gross per 24 hour  Intake 1452.59 ml  Output 900 ml  Net 552.59 ml   Filed Weights   10/22/19 0453 10/23/19 0500 10/24/19 0500  Weight: 102.2 kg 102 kg 102 kg      Physical Exam: General Appearance:  Looks criticall ill OBESE - + Head:  Normocephalic, without obvious abnormality, atraumatic Eyes:  PERRL - x, conjunctiva/corneas - muddy  Ears:  Normal external ear canals, both ears Nose:  G tube - no Throat:  ETT TUBE - yes , OG tube - yes Neck:  Supple,  No enlargement/tenderness/nodules Lungs: Clear to auscultation bilaterally, Ventilator   Synchrony - yes. 70% fio2 Heart:  S1 and S2 normal, no murmur, CVP - no.  Pressors - no Abdomen:  Soft, no masses, no organomegaly Genitalia / Rectal:  Not done Extremities:  Extremities- intact Skin:  ntact in exposed areas . Sacral area - not examined Neurologic:  Sedation - none -> RASS - -4      Assessment & Plan:   Basal ganglia ICH - Not on AC at home.  Evaluated by neurosurgery and deemed to not be a surgical  candidate.  10/24/2019 - RASS -4 without sedation  plan - Cleviprex and labetalol to maintain SBP between 100-140 per neuro. - Labetalol PRN. - Continue hypertonic saline with frequent Na checks, goal Na 150 - 155; neuro maanging - Goals of care per neuro. - Neurosurgery following for possibility of EVD placement.  Respiratory failure - Pateint unable to protect airway and intubated in ED.   10/24/2019 - > does not meet criteria for/Extubation in setting of Acute Respiratory Failure due to stroke and unresponsivleness  Plan -  PRVC - Mental status will likely be major barrier to extubation. - Might need trach if family wishes for continued aggressive care. - Follow CXR.   SIRS/Concern for sepsis  10/24/2019  Growing GNR in urine   Plan  -awaittrach aspirate - awati final urine culture  -await blood culture  -continue empiric ceftriaxone    Rest per primary team.  Best practice:  Diet: NPO. Pain/Anxiety/Delirium protocol (if indicated): propofol gtt as needed (currently off). VAP protocol (if indicated): HOB at 30 degrees. DVT prophylaxis: No DVT prophylaxis indicated given ICH GI prophylaxis: Protonix. Glucose control: SSI if glucose consistently > 180. Mobility: NA. Code Status: Full code. Family Communication: per neur     ATTESTATION & SIGNATURE   The patient Debra Huff is critically ill with multiple organ systems failure and requires high complexity decision making for assessment and support, frequent evaluation and titration of therapies, application of advanced monitoring technologies and extensive interpretation of multiple databases.   Critical Care Time devoted to patient care services described in this note is  31  Minutes. This time reflects time of care of this signee Dr Kalman Shan. This critical care time does not reflect procedure time, or teaching time or supervisory time of PA/NP/Med student/Med Resident etc but could involve care  discussion time     Dr. Kalman Shan, M.D., [facility].C.P Pulmonary and Critical Care Medicine Staff Physician [facility] Pager: [phone], If no answer or between  15:00h - 7:00h: call [phone]  10/24/2019 12:55 PM      LABS    PULMONARY Recent Labs  Lab 10/02/2019 2326  TCO2 25    CBC Recent Labs  Lab 10/22/19 0810 10/23/19 0604 10/24/19 0621  HGB 12.3 12.4 12.9  HCT 40.5 41.5 43.2  WBC 12.6* 10.4 11.0*  PLT 182 172 163    COAGULATION Recent Labs  Lab 10/09/2019 2319  INR 1.0    CARDIAC  No results for input(s): TROPONINI in the last 168 hours. No results for input(s): PROBNP in the last 168 hours.   CHEMISTRY Recent Labs  Lab 10/20/19 0245 10/20/19 0914 10/20/19 1625 10/20/19 2051 10/21/19 0326 10/21/19 0944 10/21/19  1722 10/21/19 2051 10/22/19 0810 10/22/19 1451 10/23/19 0604 10/23/19 0822 10/23/19 1425 10/23/19 2119 10/24/19 0251 10/24/19 0621 10/24/19 1045  NA 148*  149*   < >  --    < > 159*   < >  --    < > 158*   < > 160*   < > 159* 153* 160* 159* 159*  K 3.4*  --   --   --  3.7  --   --   --  3.8  --  3.6  --   --   --   --  3.6  --   CL 121*  --   --   --  >130*  --   --   --  127*  --  130*  --   --   --   --  128*  --   CO2 21*  --   --   --  21*  --   --   --  23  --  22  --   --   --   --  24  --   GLUCOSE 159*  --   --   --  146*  --   --   --  110*  --  149*  --   --   --   --  134*  --   BUN 18  --   --   --  19  --   --   --  24*  --  31*  --   --   --   --  38*  --   CREATININE 1.03*  --   --   --  0.91  --   --   --  0.90  --  0.85  --   --   --   --  0.89  --   CALCIUM 8.4*  --   --   --  8.6*  --   --   --  8.6*  --  8.5*  --   --   --   --  8.7*  --   MG 2.1   < > 2.0  --  2.0  --  2.0  --   --   --  2.4  --   --   --   --  2.4  --   PHOS 2.2*   < > 2.5  --  2.9  --  3.3  --   --   --  3.4  --   --   --   --  3.7  --    < > = values in this interval not displayed.    Estimated Creatinine Clearance: 90.5 mL/min (by C-G formula based on SCr of 0.89 mg/dL).   LIVER Recent Labs  Lab Oct 30, 2019 2319  AST 25  ALT 22  ALKPHOS 82  BILITOT 0.6  PROT 7.5  ALBUMIN 3.8  INR 1.0     INFECTIOUS Recent Labs  Lab 10/19/19 0913 10/23/19 1125 10/24/19 0621  LATICACIDVEN 1.9  --   --   PROCALCITON  --  <0.10 <0.10     ENDOCRINE CBG (last 3)  Recent Labs    10/24/19 0346 10/24/19 0724 10/24/19 1118  GLUCAP 126* 122* 130*         IMAGING x48h  - image(s) personally visualized  -   highlighted in bold CT HEAD WO CONTRAST  Result Date: 10/24/2019 CLINICAL DATA:  Follow-up examination for acute  intracranial hemorrhage. EXAM: CT HEAD WITHOUT CONTRAST TECHNIQUE: Contiguous axial images were obtained from the base of the skull through the vertex without intravenous contrast. COMPARISON:  Prior CT from 10/21/2019 FINDINGS: Brain: Large intraparenchymal hemorrhage centered at the left lentiform nucleus again seen, not significantly interval changed in size measuring approximately 6.8 x 3.5 x 5.0 cm. Surrounding low-density vasogenic edema with regional mass effect is similar to perhaps slightly increased. Associated left-to-right midline shift measures 7 mm, relatively stable. Intraventricular extension with blood seen throughout the lateral and third ventricles again seen. Mild ventriculomegaly with asymmetric dilatation of the right lateral ventricle is relatively stable. Probable mild transependymal flow of CSF also relatively unchanged. No other new intracranial hemorrhage. No other acute large vessel territory infarct. 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 No extra-axial fluid collection. Vascular: No hyperdense vessel. Skull: Scalp soft tissues and calvarium within normal limits. Sinuses/Orbits: Globes and orbital soft tissues demonstrate no acute finding. Scattered chronic mucosal thickening noted within the paranasal  sinuses. Chronic appearing right mastoid and middle ear effusion noted, unchanged. Other: None. IMPRESSION: 1. No significant interval change in large left basal ganglia intraparenchymal hemorrhage with intraventricular extension. Associated ventriculomegaly with probable transependymal flow of CSF relatively unchanged as well. Stable 7 mm left-to-right shift. 2. No other new acute intracranial abnormality. Electronically Signed   By: Rise Mu M.D.   On: 10/24/2019 02:20   DG CHEST PORT 1 VIEW  Result Date: 10/24/2019 CLINICAL DATA:  Evaluate ETT EXAM: PORTABLE CHEST 1 VIEW COMPARISON:  October 23, 2019 FINDINGS: The ETT is in good position. The NG tube terminates below today's film. The right IJ is stable. No pneumothorax. No focal infiltrates or overt edema. Stable cardiomediastinal silhouette. IMPRESSION: 1. Support apparatus as above. 2. No other acute interval changes. Electronically Signed   By: Gerome Sam III M.D   On: 10/24/2019 12:02   DG CHEST PORT 1 VIEW  Result Date: 10/23/2019 CLINICAL DATA:  49 year old female intubated. Code stroke presentation on 10/17/2019 with intracranial hemorrhage. EXAM: PORTABLE CHEST 1 VIEW COMPARISON:  Portable chest 10/20/2019 and earlier. FINDINGS: Portable AP semi upright view at 0547 hours. Intubated, ET tube tip in good position between the level the clavicles and carina. Enteric tube courses into the stomach, tip not included. Stable right IJ central line. Stable cardiac size at the upper limits of normal. Other mediastinal contours are within normal limits. Streaky retrocardiac opacity is stable compatible with subsegmental lower lobe atelectasis. Elsewhere when Allowing for portable technique the lungs are clear. Paucity of bowel gas. No acute osseous abnormality identified. IMPRESSION: 1.  Stable lines and tubes. 2. Stable left lower lobe atelectasis. No new cardiopulmonary abnormality. Electronically Signed   By: Odessa Fleming M.D.   On: 10/23/2019  07:50

## 2019-10-24 NOTE — Progress Notes (Signed)
STROKE TEAM PROGRESS NOTE   INTERVAL HISTORY No family at bedside. Pt had fever overnight, found to have mild UTI and now staph found in blood cultures. Foley catheter removed. Bladder scan with I & O PRN. Na 160 this am, 3% saline discontinued. On TF. BP elevated and put back on cleviprex and needing to increase to maintain < 160. Pt seems less responsive of LUE and LLE as yesterday, non-purposeful, only responds to pain,  will repeat CT head. Dr. Erlinda Hong discussed with husband Quita Skye, they will have family meeting today at home.   Vitals:   10/24/19 0500 10/24/19 0600 10/24/19 0700 10/24/19 0803  BP: (!) 149/79 (!) 155/78 (!) 153/71 (!) 161/81  Pulse: 78 79 77 82  Resp: (!) 21 20 18  (!) 23  Temp:      TempSrc:      SpO2: 97% 98% 98% 97%  Weight: 102 kg     Height:        CBC:  Recent Labs  Lab 10/24/2019 2319 10/28/2019 2326 10/23/19 0604 10/24/19 0621  WBC 11.6*   < > 10.4 11.0*  NEUTROABS 5.5  --   --   --   HGB 15.5*   < > 12.4 12.9  HCT 48.9*   < > 41.5 43.2  MCV 97.8   < > 100.5* 100.5*  PLT 235   < > 172 163   < > = values in this interval not displayed.    Basic Metabolic Panel:  Recent Labs  Lab 10/23/19 0604 10/23/19 0822 10/24/19 0251 10/24/19 0621  NA 160*   < > 160* 159*  K 3.6  --   --  3.6  CL 130*  --   --  128*  CO2 22  --   --  24  GLUCOSE 149*  --   --  134*  BUN 31*  --   --  38*  CREATININE 0.85  --   --  0.89  CALCIUM 8.5*  --   --  8.7*  MG 2.4  --   --  2.4  PHOS 3.4  --   --  3.7   < > = values in this interval not displayed.   Lipid Panel:     Component Value Date/Time   CHOL 152 10/20/2019 0245   TRIG 108 10/23/2019 0604   HDL 46 10/20/2019 0245   CHOLHDL 3.3 10/20/2019 0245   VLDL 22 10/20/2019 0245   LDLCALC 84 10/20/2019 0245   HgbA1c:  Lab Results  Component Value Date   HGBA1C 5.7 (H) 10/20/2019   Urine Drug Screen:     Component Value Date/Time   LABOPIA NONE DETECTED 10/19/2019 0600   COCAINSCRNUR NONE DETECTED 10/19/2019  0600   LABBENZ NONE DETECTED 10/19/2019 0600   AMPHETMU POSITIVE (A) 10/19/2019 0600   THCU NONE DETECTED 10/19/2019 0600   LABBARB NONE DETECTED 10/19/2019 0600    Alcohol Level     Component Value Date/Time   ETH <10 10/19/2019 0057    IMAGING past 24 hours  CT HEAD WO CONTRAST 10/24/2019 IMPRESSION:  1. No significant interval change in large left basal ganglia intraparenchymal hemorrhage with intraventricular extension. Associated ventriculomegaly with probable transependymal flow of CSF relatively unchanged as well. Stable 7 mm left-to-right shift.  2. No other new acute intracranial abnormality.    PHYSICAL EXAM   Temp:  [99.6 F (37.6 C)-102 F (38.9 C)] 99.6 F (37.6 C) (04/24 0400) Pulse Rate:  [73-91] 82 (04/24 0803) Resp:  [  18-28] 23 (04/24 0803) BP: (112-176)/(51-111) 161/81 (04/24 0803) SpO2:  [85 %-99 %] 97 % (04/24 0803) FiO2 (%):  [40 %-100 %] 100 % (04/24 0803) Weight:  [102 kg] 102 kg (04/24 0500)  General - Well nourished, well developed, intubated off sedation.  Ophthalmologic - fundi not visualized due to noncooperation.  Cardiovascular - Regular rate and rhythm. No changes in neuro exam Neuro - intubated off propofol, eyes closed, not following commands. With forced eye opening, eyes in mid position, not blinking to visual threat, doll's eyes sluggish, not tracking, PERRL. Corneal reflex weak on the left, absent on the right, gag and cough present. Breathing  over the vent.  Facial symmetry not able to test due to ET tube.  Tongue protrusion outside of mouth with edema. On pain stimulation, withdraw at least 3+/5 LUE and 3/5LLE, but 2/5 RLE but 0/5 RUE. DTR 1+ and bilateral positive babinski. Sensation, coordination and gait not tested.   ASSESSMENT/PLAN [name] is a 49 y.o. female with uncontrolled HTN presenting with R sided weakness, R facial droop, diaphoreses, leaning to the R and unresponsive. Found to be twitching all over w/ pinpoint  pupils and SBP > 260.   Stroke:  R large basal ganglia ICH w/ IVH, SAH and associated cerebral edema w/ subfalcine herniation, hemorrhage secondary to hypertension   CT head large L basal ganglia ICH w/ SAH and 36mm R midline shift. R lateral ventricle entrapment w/ dilatation of atrium and temporal horn.   Repeat CT head 4/19 mild extension large L basal ganglia ICH. Stable IVH. Stable ventriculomegaly. 75mm midline shift. No new abnormality.  CT head 4/20 slightly smaller L basal ganglia and corona radiata ICH. Stable IVH. Stable 57mm midline shift. Stable dilation R temporal horn.    CTA head & neck No intracranial atherosclerosis. Atherosclerosis L ICA bifurcation and B cavernous ICAs.  CT head 4/21 unchanged hematoma and midline shift.  CT head 4/24 - No significant interval change in large left basal ganglia intraparenchymal hemorrhage with intraventricular extension. Associated ventriculomegaly with probable transependymal flow of CSF relatively unchanged as well. Stable 7 mm left-to-right shift.   2D Echo EF 60-65%  LDL 84  HgbA1c 5.7  SCDs for VTE prophylaxis  No antithrombotic prior to admission, now on No antithrombotic given hemorrhage   Therapy recommendations:  reconsult when stable for evaluation  Disposition:  Pending  ICH score = 3   Long-term neuro prognosis poor. Family meeting at pts father's house Saturday at 1p to discuss GOC. They plan to come to the hospital Sun 9-10a. (To meet w neurologist Sunday per sign out) They will call with decision if made after the meeting.  Acute Respiratory Failure d/t ICH  Intubated in ED  CCM on board   On vent  Off sedation  Likely will need trach if ongoing aggressive care  Fever, Leukocytosis   Tmax 102.4->102.3->102.3->99.6  WBC 11.6->17.9->14.2->12.6->10.4->11.0  UA w/ bacteria, WBC 21-50  Check UCx pending   CXR stable 4/23  Tylenol and ice packs  Resp Cx pending   Blood Cx 4/24 - Methicillin  (oxacillin) susceptible coagulase negative staphylococcus.  Rocephin 4/23>> Per pharmacist - no change in antibiotic therapy indicated with blood culture results.  CXR 4/24 - pending  Cerebral Edema  NSG consulted [name]) no role for surgical intervention on admission. Following for possible EVD.  CT head 4/19 6->52mm midline shift   CT head 4/20 stable 37mm midline shift  CT head 4/21 stable 7 mm midline shift  CT head 4/24 - No significant interval change in large left basal ganglia intraparenchymal hemorrhage with intraventricular extension. Associated ventriculomegaly with probable transependymal flow of CSF relatively unchanged as well. Stable 7 mm left-to-right shift.   On 3% @ 50/h -> NS @ 50 -> off   23.4% saline x 1  Goal Na 150-155  Na 139->...->162->160->159  Na Q6h  Hypertensive Emergency  Home meds:  None  SBP > 260 on arrival . SBP goal < 160 (SBP now 150's to 160's)  On IV Cleviprex  Off labetalol gtt  Norvasc 10 (max dose)   Labetalol 200 tid->300 tid (Max dose 2400 mg per day) (pt not bradycardic) Will increase to 400 mg tid  Losartan 50 bid (max dose)  Hydralazine 50 tid -> 100 tid (max dose) . Long-term BP goal normotensive  Hyperlipidemia  LDL 84, goal LDL < 70   on no statin PTA  Add statin on discharge  Dysphagia . Secondary to stroke . NPO . On tube feedings @ 40cc w/ prostat . Speech on board   Other Stroke Risk Factors  UDS:  Amphetamines POSITIVE - cessation education will be provided  Obesity, Body mass index is 37.42 kg/m., recommend weight loss, diet and exercise as appropriate   Other Active Problems  Polycythemia Hb 15.5->16.3->13.5->12.1->12.3->12.4->12.9 resolved  Hypokalemia 3.4->3.7->3.8->3.6->3.6 - supplemented - resolved  Hypophosphatemia 2.2->1.9->2.5->2.9->3.7  - supplemented - resolved  Urinary retention, foley placed  Constipation. Bowel regimen added.  Hospital day # 5  This patient is critically  ill due to large ICH, IVH, SAH, ventriculomegaly, hypertensive emergency and at significant risk of neurological worsening, death form hematoma expansion, cerebral edema, brain herniation, seizure, hydrocephalus, heart failure.   This patient is critically ill and at significant risk of neurological worsening, death and care requires constant monitoring of vital signs, hemodynamics,respiratory and cardiac monitoring,review of multiple databases, neurological assessment, discussion with family, other specialists and medical decision making of high complexity.I  As per Amada Jupiter, husband, Family meeting at pts father's house Saturday at 1p to discuss GOC. They plan to come to the hospital Sun 9-10a. They will call with decision if made after the meeting.  I spent 45 minutes of neurocritical care time in the care of this patient.  Naomie Dean, MD [facility]    To contact Stroke Continuity provider, please refer to [email or web address]. After hours, contact General Neurology

## 2019-10-24 NOTE — Progress Notes (Signed)
Patient ID: Debra Huff, female   DOB: 1970-10-28, 49 y.o.   MRN: [id number] BP (!) 168/82   Pulse 83   Temp 100.2 F (37.9 C) (Axillary)   Resp 19   Ht 5\' 5"  (1.651 m)   Wt 102 kg   SpO2 98%   BMI 37.42 kg/m  Not responsive, intubated Perrl, small round equal Not localizing but winces with noxious stimuli +cough, gag Slight flexion in lower extremity with noxious stimuli Febrile, started on abx Poor condition at this time.  heaD CT reviewed, no real change, 4th ventricle still open, right lateral ventricle not casted continued shift

## 2019-10-25 LAB — CBC
HCT: 42.9 % (ref 36.0–46.0)
Hemoglobin: 13 g/dL (ref 12.0–15.0)
MCH: 30.7 pg (ref 26.0–34.0)
MCHC: 30.3 g/dL (ref 30.0–36.0)
MCV: 101.4 fL — ABNORMAL HIGH (ref 80.0–100.0)
Platelets: 165 10*3/uL (ref 150–400)
RBC: 4.23 MIL/uL (ref 3.87–5.11)
RDW: 16.5 % — ABNORMAL HIGH (ref 11.5–15.5)
WBC: 11.5 10*3/uL — ABNORMAL HIGH (ref 4.0–10.5)
nRBC: 0 % (ref 0.0–0.2)

## 2019-10-25 LAB — GLUCOSE, CAPILLARY
Glucose-Capillary: 109 mg/dL — ABNORMAL HIGH (ref 70–99)
Glucose-Capillary: 110 mg/dL — ABNORMAL HIGH (ref 70–99)
Glucose-Capillary: 112 mg/dL — ABNORMAL HIGH (ref 70–99)
Glucose-Capillary: 116 mg/dL — ABNORMAL HIGH (ref 70–99)
Glucose-Capillary: 122 mg/dL — ABNORMAL HIGH (ref 70–99)
Glucose-Capillary: 134 mg/dL — ABNORMAL HIGH (ref 70–99)

## 2019-10-25 LAB — URINE CULTURE: Culture: >=100000 — AB

## 2019-10-25 LAB — SODIUM
Sodium: 158 mmol/L — ABNORMAL HIGH (ref 135–145)
Sodium: 158 mmol/L — ABNORMAL HIGH (ref 135–145)
Sodium: 157 mmol/L — ABNORMAL HIGH (ref 135–145)

## 2019-10-25 LAB — BASIC METABOLIC PANEL
Anion gap: 8 (ref 5–15)
BUN: 39 mg/dL — ABNORMAL HIGH (ref 6–20)
CO2: 25 mmol/L (ref 22–32)
Calcium: 8.7 mg/dL — ABNORMAL LOW (ref 8.9–10.3)
Chloride: 125 mmol/L — ABNORMAL HIGH (ref 98–111)
Creatinine, Ser: 0.79 mg/dL (ref 0.44–1.00)
GFR calc Af Amer: >60 mL/min (ref >60)
GFR calc non Af Amer: >60 mL/min (ref >60)
Glucose, Bld: 123 mg/dL — ABNORMAL HIGH (ref 70–99)
Potassium: 3.6 mmol/L (ref 3.5–5.1)
Sodium: 158 mmol/L — ABNORMAL HIGH (ref 135–145)

## 2019-10-25 LAB — PHOSPHORUS: Phosphorus: 3.5 mg/dL (ref 2.5–4.6)

## 2019-10-25 LAB — MAGNESIUM: Magnesium: 2.5 mg/dL — ABNORMAL HIGH (ref 1.7–2.4)

## 2019-10-25 LAB — PROCALCITONIN: Procalcitonin: <0.1 ng/mL

## 2019-10-25 MED ORDER — CEFAZOLIN SODIUM-DEXTROSE 2-4 GM/100ML-% IV SOLN
2.0000 g | Freq: Three times a day (TID) | INTRAVENOUS | Status: DC
  Administered 2019-10-25 – 2019-10-27 (×6): 2 g via INTRAVENOUS
  Filled 2019-10-25 (×6): qty 100

## 2019-10-25 NOTE — Progress Notes (Signed)
NAME:  Debra Huff, MRN:  [id number], DOB:  27-Sep-1970, LOS: 6 ADMISSION DATE:  10/26/2019, CONSULTATION DATE:  10/25/19 REFERRING MD:  Otelia Limes, CHIEF COMPLAINT:  Hemorrhagic CVA   Brief History    Debra Huff is a 49 y.o. F with HTN for which she has not sought medical care or been taking medications for years per her sister.  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   Seen by neurology and neurosurgery and deemed not a surgical candidate.  Intubated and PCCM consulted for vent management.   Extremely hypertensive 269/136 and started on Cleviprex gtt.  Past Medical History  HTN  Significant Hospital Events   4/18 Admit to ICU, neurology primary  4/20 -= No acute events.  ON cleviprex and labetalol now to maintain SBP < 140. Remains on 3%. Tolerating PSV wean on 5/5 most of the day 4/19.  4/21 - unresponsive on vent. Family of husband, dad and 2 sons at bedside for goals of care with Dr Roda Shutters  4/22 - per dr Roda Shutters - does not move RUE but moves others. Still on cleviprex and 3% saline. On vent   423 =  Na 162 and neuro holding 3% saline. Remains on cleviprex and vent. Not on sedation gtt. Did have fever 102.14F but wbc down. UA dwith pyuria and bacteriuria  4/24 - still febrile. Urine culture with GNR. On vent. On 3% saline - Na 159. On vent without sedation, 70% fio2, peep 5-> pulse ox 97%.  CT head without change - >  7mm Left to right shift and Large Left basal ganglian Hge with IV extension. Ventriculomegaly +   Consults:  Neurosurgery  PCCM  Procedures:  4/19 ETT 4/19 R IJ CVC  Significant Diagnostic Tests:  10/19/19 - U tox - amphetamines +   10/24/19 CT head>> Large intraparenchymal hematoma centered in the left basal ganglia with subarachnoid extension and 6 mm rightward midlinesShift. Right lateral ventricle  entrapment with dilatation of the atrium and temporal horn.   CTA head 4/19 > stable basal ganglia and corona radiata hemorrhage.  Stable IVH, stable 71mm MLS.  Stable dilatation of right temporal tip.  CT head 4/24 -   105mm Left to right shift and Large Left basal ganglian Hge with IV extension. Ventriculomegaly +    Micro Data:  10/25/19 Sars-Cov-2>>negative  4/19 - MRS PCR neg Xx 4/23 - blood =4/23 - cog neg staph x 1  urine - 100K  E colii 4/23 - trach -  STAPHYLOCOCCUS AUREUS - MSSA    Antimicrobials:  4/23 ceftriaxone >>4/25 4/25 cefazolin >>    Interim history/subjective:   10/25/2019 - remains unresponsive. No new recs from [facility]. Growing MSSA in Lung (VAP) and E colii UTI. Family convening at patient father's house to discuss goals of care. Long term prognosis poor per neuro. FEver + but cuver better. Off cleviprex and 3% saline per primary team  Objective   Blood pressure (!) 157/68, pulse 77, temperature 100.2 F (37.9 C), temperature source Axillary, resp. rate 18, height 5\' 5"  (1.651 m), weight 102 kg, SpO2 97 %.    Vent Mode: PSV;CPAP FiO2 (%):  [40 %-60 %] 40 % Set Rate:  [18 bmp] 18 bmp Vt Set:  [450 mL] 450 mL PEEP:  [5 cmH20] 5 cmH20 Pressure Support:  [10 cmH20] 10 cmH20 Plateau Pressure:   cmH20-16 cmH20] 16 cmH20   Intake/Output Summary (Last 24 hours) at 10/25/2019 1643 Last data filed at 10/25/2019 1600 Gross per 24 hour  Intake 848.54 ml  Output 450 ml  Net 398.54 ml   Filed Weights   10/23/19 0500 10/24/19 0500 10/25/19 0500  Weight: 102 kg 102 kg 102 kg      Physical Exam: General Appearance:  Looks criticall ill OBESE - + Head:  Normocephalic, without obvious abnormality, atraumatic Eyes:  PERRL - x, conjunctiva/corneas - muddy     Ears:  Normal external ear canals, both ears Nose:  G tube - no Throat:  ETT TUBE - yes , OG tube - yes Neck:  Supple,  No enlargement/tenderness/nodules Lungs: Clear to auscultation bilaterally,  Ventilator   Synchrony - yes. 40% fio2 Heart:  S1 and S2 normal, no murmur, CVP - no.  Pressors - no Abdomen:  Soft, no masses, no organomegaly Genitalia / Rectal:  Not done Extremities:  Extremities- intact Skin:  ntact in exposed areas . Sacral area - not examined Neurologic:  Sedation - none -> RASS - -4    Overall unchagned exam  Assessment & Plan:   Basal ganglia ICH - Not on AC at home.  Evaluated by neurosurgery and deemed to not be a surgical candidate.  10/25/2019 - RASS -4 without sedation. Off cleviprex. Off 3% saline. No new recs from NSGY  plan - prn bp meds per neuro sbp goal - Labetalol PRN. - - Goals of care per neuro. - Neurosurgery following for possibility of EVD placement.  Respiratory failure - Pateint unable to protect airway and intubated in ED.   10/25/2019 - > does not meet criteria for/Extubation in setting of Acute Respiratory Failure due to stroke and unresponsivleness  Plan -  PRVC - Mental status will likely be major barrier to extubation. - Might need trach if family wishes for continued aggressive care. - Follow CXR.   SIRS/sepsis - VAP MSSA ( and E colii UTI  10/25/2019  - dual infections MSSA lung and e colii uti  Plan  -ancef   Rest per primary team.  Best practice:  Diet: NPO. Pain/Anxiety/Delirium protocol (if indicated): propofol gtt as needed (currently off). VAP protocol (if indicated): HOB at 30 degrees. DVT prophylaxis: No DVT prophylaxis indicated given ICH GI prophylaxis: Protonix. Glucose control: SSI if glucose consistently > 180. Mobility: NA. Code Status: Full code but family contemplating goals Family Communication: per neuro      ATTESTATION & SIGNATURE   The patient Debra Huff is critically ill with multiple organ systems failure and requires high complexity decision making for assessment and support, frequent evaluation and titration of therapies, application of advanced monitoring technologies and  extensive interpretation of multiple databases.   Critical Care Time devoted to patient care services described in this note is  31  Minutes. This time reflects time of care of this signee Dr Kalman Shan. This critical care time does not reflect procedure time, or teaching time or supervisory time of PA/NP/Med student/Med Resident etc but could involve care discussion time     Dr. Kalman Shan, M.D., [facility].C.P Pulmonary and Critical Care Medicine Staff Physician [facility]  Pulmonary and Critical Care Pager: [phone], If no answer or between  15:00h - 7:00h: call [phone]  10/25/2019 4:54 PM       LABS    PULMONARY Recent Labs  Lab 11/10/2019 2326  TCO2 25    CBC Recent  Labs  Lab 10/23/19 0604 10/24/19 0621 10/25/19 0343  HGB 12.4 12.9 13.0  HCT 41.5 43.2 42.9  WBC 10.4 11.0* 11.5*  PLT 172 163 165    COAGULATION Recent Labs  Lab 10/23/2019 2319  INR 1.0    CARDIAC  No results for input(s): TROPONINI in the last 168 hours. No results for input(s): PROBNP in the last 168 hours.   CHEMISTRY Recent Labs  Lab 10/21/19 0326 10/21/19 0944 10/21/19 1722 10/21/19 2051 10/22/19 0810 10/22/19 1451 10/23/19 0604 10/23/19 0822 10/24/19 0621 10/24/19 1045 10/24/19 1745 10/24/19 2051 10/25/19 0343 10/25/19 0820 10/25/19 1410  NA 159*   < >  --    < > 158*   < > 160*   < > 159*   < > 158* 160* 158* 158* 158*  K 3.7  --   --   --  3.8  --  3.6   < > 3.6  --   --   --  3.6  --   --   CL >130*  --   --   --  127*  --  130*  --  128*  --   --   --  125*  --   --   CO2 21*  --   --   --  23  --  22  --  24  --   --   --  25  --   --   GLUCOSE 146*  --   --   --  110*  --  149*  --  134*  --   --   --  123*  --   --   BUN 19  --   --   --  24*  --  31*  --  38*  --   --   --  39*  --   --   CREATININE 0.91  --   --   --  0.90  --  0.85  --  0.89  --   --   --  0.79  --   --   CALCIUM 8.6*  --   --   --  8.6*  --  8.5*  --  8.7*  --    --   --  8.7*  --   --   MG 2.0  --  2.0  --   --   --  2.4  --  2.4  --   --   --  2.5*  --   --   PHOS 2.9  --  3.3  --   --   --  3.4  --  3.7  --   --   --  3.5  --   --    < > = values in this interval not displayed.   Estimated Creatinine Clearance: 100.7 mL/min (by C-G formula based on SCr of 0.79 mg/dL).   LIVER Recent Labs  Lab 10/04/2019 2319  AST 25  ALT 22  ALKPHOS 82  BILITOT 0.6  PROT 7.5  ALBUMIN 3.8  INR 1.0     INFECTIOUS Recent Labs  Lab 10/19/19 0913 10/23/19 1125 10/24/19 0621 10/25/19 0343  LATICACIDVEN 1.9  --   --   --   PROCALCITON  --  <0.10 <0.10 <0.10     ENDOCRINE CBG (last 3)  Recent Labs    10/25/19 0729 10/25/19 1124 10/25/19 1521  GLUCAP 110* 122* 109*  IMAGING x48h  - image(s) personally visualized  -   highlighted in bold CT HEAD WO CONTRAST  Result Date: 10/24/2019 CLINICAL DATA:  Follow-up examination for acute intracranial hemorrhage. EXAM: CT HEAD WITHOUT CONTRAST TECHNIQUE: Contiguous axial images were obtained from the base of the skull through the vertex without intravenous contrast. COMPARISON:  Prior CT from 10/21/2019 FINDINGS: Brain: Large intraparenchymal hemorrhage centered at the left lentiform nucleus again seen, not significantly interval changed in size measuring approximately 6.8 x 3.5 x 5.0 cm. Surrounding low-density vasogenic edema with regional mass effect is similar to perhaps slightly increased. Associated left-to-right midline shift measures 7 mm, relatively stable. Intraventricular extension with blood seen throughout the lateral and third ventricles again seen. Mild ventriculomegaly with asymmetric dilatation of the right lateral ventricle is relatively stable. Probable mild transependymal flow of CSF also relatively unchanged. No other new intracranial hemorrhage. No other acute large vessel territory infarct. 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 No extra-axial  fluid collection. Vascular: No hyperdense vessel. Skull: Scalp soft tissues and calvarium within normal limits. Sinuses/Orbits: Globes and orbital soft tissues demonstrate no acute finding. Scattered chronic mucosal thickening noted within the paranasal sinuses. Chronic appearing right mastoid and middle ear effusion noted, unchanged. Other: None. IMPRESSION: 1. No significant interval change in large left basal ganglia intraparenchymal hemorrhage with intraventricular extension. Associated ventriculomegaly with probable transependymal flow of CSF relatively unchanged as well. Stable 7 mm left-to-right shift. 2. No other new acute intracranial abnormality. Electronically Signed   By: Rise Mu M.D.   On: 10/24/2019 02:20   DG CHEST PORT 1 VIEW  Result Date: 10/24/2019 CLINICAL DATA:  Evaluate ETT EXAM: PORTABLE CHEST 1 VIEW COMPARISON:  October 23, 2019 FINDINGS: The ETT is in good position. The NG tube terminates below today's film. The right IJ is stable. No pneumothorax. No focal infiltrates or overt edema. Stable cardiomediastinal silhouette. IMPRESSION: 1. Support apparatus as above. 2. No other acute interval changes. Electronically Signed   By: Gerome Sam III M.D   On: 10/24/2019 12:02

## 2019-10-25 NOTE — Progress Notes (Signed)
STROKE TEAM PROGRESS NOTE   INTERVAL HISTORY No family at bedside. Dr. Roda Shutters discussed with husband Debra Huff, they will have family meeting at home this weekend, poor prognosis.  Vitals:   10/25/19 1200 10/25/19 1300 10/25/19 1400 10/25/19 1523  BP: 135/66 (!) 142/68 (!) 149/64 (!) 146/72  Pulse: 72 73 76 78  Resp: 20 20 (!) 21 14  Temp: 100.2 F (37.9 C)     TempSrc: Axillary     SpO2: 97% 96% 97% 97%  Weight:      Height:        CBC:  Recent Labs  Lab 10/15/2019 2319 10/26/2019 2326 10/24/19 0621 10/25/19 0343  WBC 11.6*   < > 11.0* 11.5*  NEUTROABS 5.5  --   --   --   HGB 15.5*   < > 12.9 13.0  HCT 48.9*   < > 43.2 42.9  MCV 97.8   < > 100.5* 101.4*  PLT 235   < > 163 165   < > = values in this interval not displayed.    Basic Metabolic Panel:  Recent Labs  Lab 10/24/19 0621 10/24/19 1045 10/25/19 0343 10/25/19 0343 10/25/19 0820 10/25/19 1410  NA 159*   < > 158*   < > 158* 158*  K 3.6  --  3.6  --   --   --   CL 128*  --  125*  --   --   --   CO2 24  --  25  --   --   --   GLUCOSE 134*  --  123*  --   --   --   BUN 38*  --  39*  --   --   --   CREATININE 0.89  --  0.79  --   --   --   CALCIUM 8.7*  --  8.7*  --   --   --   MG 2.4  --  2.5*  --   --   --   PHOS 3.7  --  3.5  --   --   --    < > = values in this interval not displayed.   Lipid Panel:     Component Value Date/Time   CHOL 152 10/20/2019 0245   TRIG 108 10/23/2019 0604   HDL 46 10/20/2019 0245   CHOLHDL 3.3 10/20/2019 0245   VLDL 22 10/20/2019 0245   LDLCALC 84 10/20/2019 0245   HgbA1c:  Lab Results  Component Value Date   HGBA1C 5.7 (H) 10/20/2019   Urine Drug Screen:     Component Value Date/Time   LABOPIA NONE DETECTED 10/19/2019 0600   COCAINSCRNUR NONE DETECTED 10/19/2019 0600   LABBENZ NONE DETECTED 10/19/2019 0600   AMPHETMU POSITIVE (A) 10/19/2019 0600   THCU NONE DETECTED 10/19/2019 0600   LABBARB NONE DETECTED 10/19/2019 0600    Alcohol Level     Component Value  Date/Time   ETH <10 10/19/2019 0057    IMAGING past 24 hours  CT HEAD WO CONTRAST 10/24/2019 IMPRESSION:  1. No significant interval change in large left basal ganglia intraparenchymal hemorrhage with intraventricular extension. Associated ventriculomegaly with probable transependymal flow of CSF relatively unchanged as well. Stable 7 mm left-to-right shift.  2. No other new acute intracranial abnormality.    PHYSICAL EXAM   Temp:  [99 F (37.2 C)-100.3 F (37.9 C)] 100.2 F (37.9 C) (04/25 1200) Pulse Rate:  [65-84] 78 (04/25 1523) Resp:  [14-29] 14 (04/25 1523) BP: (  107-161)/(51-80) 146/72 (04/25 1523) SpO2:  [95 %-100 %] 97 % (04/25 1523) FiO2 (%):  [40 %-70 %] 40 % (04/25 1523) Weight:  [102 kg] 102 kg (04/25 0500)  General - Well nourished, well developed, intubated off sedation.  Ophthalmologic - fundi not visualized due to noncooperation.  Cardiovascular - Regular rate and rhythm. No changes in neuro exam Neuro - intubated off propofol, eyes closed, not following commands. With forced eye opening, eyes in mid position, not blinking to visual threat, doll's eyes sluggish, not tracking, PERRL. Corneal reflex weak on the left, absent on the right, gag and cough present. Breathing  over the vent.  Facial symmetry not able to test due to ET tube.  Tongue protrusion outside of mouth with edema. On pain stimulation, withdraw at least 3+/5 LUE and 3/5LLE, but 2/5 RLE but 0/5 RUE. DTR 1+ and bilateral positive babinski. Sensation, coordination and gait not tested.   ASSESSMENT/PLAN [name] is a 49 y.o. female with uncontrolled HTN presenting with R sided weakness, R facial droop, diaphoreses, leaning to the R and unresponsive. Found to be twitching all over w/ pinpoint pupils and SBP > 260.   Stroke:  R large basal ganglia ICH w/ IVH, SAH and associated cerebral edema w/ subfalcine herniation, hemorrhage secondary to hypertension   CT head large L basal ganglia ICH w/  SAH and 56mm R midline shift. R lateral ventricle entrapment w/ dilatation of atrium and temporal horn.   Repeat CT head 4/19 mild extension large L basal ganglia ICH. Stable IVH. Stable ventriculomegaly. 84mm midline shift. No new abnormality.  CT head 4/20 slightly smaller L basal ganglia and corona radiata ICH. Stable IVH. Stable 41mm midline shift. Stable dilation R temporal horn.    CTA head & neck No intracranial atherosclerosis. Atherosclerosis L ICA bifurcation and B cavernous ICAs.  CT head 4/21 unchanged hematoma and midline shift.  CT head 4/24 - No significant interval change in large left basal ganglia intraparenchymal hemorrhage with intraventricular extension. Associated ventriculomegaly with probable transependymal flow of CSF relatively unchanged as well. Stable 7 mm left-to-right shift.   2D Echo EF 60-65%  LDL 84  HgbA1c 5.7  SCDs for VTE prophylaxis  No antithrombotic prior to admission, now on No antithrombotic given hemorrhage   Therapy recommendations:  reconsult when stable for evaluation  Disposition:  Pending  ICH score = 3   Long-term neuro prognosis poor. Family meeting at pts father's house this weekend to discuss GOC. They will call with decision if made after the meeting.  Acute Respiratory Failure d/t ICH  Intubated in ED  CCM on board   On vent  Off sedation  Likely will need trach if ongoing aggressive care  Fever, Leukocytosis   Tmax 102.4->102.3->102.3->99.6->100.2  WBC 11.6->17.9->14.2->12.6->10.4->11.0->11.5  UA w/ bacteria, WBC 21-50  Check UCx - >100,000 col E-coli sensitive to Ceftriaxone  CXR stable 4/23  Tylenol and ice packs  Resp Cx - ABUNDANT STAPHYLOCOCCUS AUREUS -> ceftriaxone changed to Ancef today by CCM  Blood Cx 4/24 - Methicillin (oxacillin) susceptible coagulase negative staphylococcus.  Rocephin 4/23>> Per pharmacist - no change in antibiotic therapy indicated with blood culture results.  CXR 4/24 -  Support apparatus as above. No other acute interval changes.  Cerebral Edema  NSG consulted [name]) no role for surgical intervention on admission. Following for possible EVD.  CT head 4/19 6->67mm midline shift   CT head 4/20 stable 28mm midline shift  CT head 4/21 stable 7 mm midline shift  CT head 4/24 - No significant interval change in large left basal ganglia intraparenchymal hemorrhage with intraventricular extension. Associated ventriculomegaly with probable transependymal flow of CSF relatively unchanged as well. Stable 7 mm left-to-right shift.   On 3% @ 50/h -> NS @ 50 -> off   23.4% saline x 1  Goal Na 150-155  Na 139->...->162->160->159->158  Na Q6h  Hypertensive Emergency  Home meds:  None  SBP > 260 on arrival . SBP goal < 160 (Sunday SBP 130's to 140's after increasing Labetalol dose yesterday)  On IV Cleviprex  Off labetalol gtt  Norvasc 10 (max dose)   Labetalol 200 tid->300 tid (Max dose 2400 mg per day) (pt not bradycardic) Will increase to 400 mg tid  Losartan 50 bid (max dose)  Hydralazine 50 tid -> 100 tid (max dose) . Long-term BP goal normotensive  Hyperlipidemia  LDL 84, goal LDL < 70   on no statin PTA  Add statin on discharge  Dysphagia . Secondary to stroke . NPO . On tube feedings @ 40cc w/ prostat . Speech on board   Other Stroke Risk Factors  UDS:  Amphetamines POSITIVE - cessation education will be provided  Obesity, Body mass index is 37.42 kg/m., recommend weight loss, diet and exercise as appropriate   Other Active Problems  Polycythemia Hb 15.5->16.3->13.5->12.1->12.3->12.4->12.9->13.0 resolved  Hypokalemia 3.4->3.7->3.8->3.6->3.6 - supplemented - resolved  Hypophosphatemia 2.2->1.9->2.5->2.9->3.7->3.5- supplemented - resolved  Urinary retention, foley placed  Constipation. Bowel regimen added.  Hospital day # 6  This patient is critically ill due to large ICH, IVH, SAH, ventriculomegaly, hypertensive  emergency and at significant risk of neurological worsening, death form hematoma expansion, cerebral edema, brain herniation, seizure, hydrocephalus, heart failure.   This patient is critically ill and at significant risk of neurological worsening, death and care requires constant monitoring of vital signs, hemodynamics,respiratory and cardiac monitoring,review of multiple databases, neurological assessment, discussion with family, other specialists and medical decision making of high complexity.I  Long-term neuro prognosis poor. Family meeting at pts father's house this weekend to discuss [place]. They will call with decision if made after the meeting.  I spent 35 minutes of neurocritical care time in the care of this patient.      To contact Stroke Continuity provider, please refer to [email or web address] After hours, contact General Neurology

## 2019-10-25 NOTE — Progress Notes (Signed)
No significant change in status.  Patient with dominant hemisphere basal ganglia hemorrhage with some intraventricular hemorrhage.  No evidence of progressive, significant hydrocephalus.  Continue supportive efforts from our standpoint.  No new recommendations.

## 2019-10-25 NOTE — Progress Notes (Signed)
8 silver colored rings and 1 silver colored bracelet returned to patients sister Dagoberto Ligas.

## 2019-10-25 NOTE — Progress Notes (Signed)
Assisted tele visit to patient with son.  Renard Caperton M, RN  

## 2019-10-26 DIAGNOSIS — G936 Cerebral edema: Secondary | ICD-10-CM

## 2019-10-26 DIAGNOSIS — G935 Compression of brain: Secondary | ICD-10-CM

## 2019-10-26 LAB — SODIUM
Sodium: 157 mmol/L — ABNORMAL HIGH (ref 135–145)
Sodium: 157 mmol/L — ABNORMAL HIGH (ref 135–145)
Sodium: 154 mmol/L — ABNORMAL HIGH (ref 135–145)

## 2019-10-26 LAB — BASIC METABOLIC PANEL
Anion gap: 5 (ref 5–15)
BUN: 41 mg/dL — ABNORMAL HIGH (ref 6–20)
CO2: 24 mmol/L (ref 22–32)
Calcium: 8.6 mg/dL — ABNORMAL LOW (ref 8.9–10.3)
Chloride: 127 mmol/L — ABNORMAL HIGH (ref 98–111)
Creatinine, Ser: 0.78 mg/dL (ref 0.44–1.00)
GFR calc Af Amer: >60 mL/min (ref >60)
GFR calc non Af Amer: >60 mL/min (ref >60)
Glucose, Bld: 144 mg/dL — ABNORMAL HIGH (ref 70–99)
Potassium: 3.6 mmol/L (ref 3.5–5.1)
Sodium: 156 mmol/L — ABNORMAL HIGH (ref 135–145)

## 2019-10-26 LAB — GLUCOSE, CAPILLARY
Glucose-Capillary: 110 mg/dL — ABNORMAL HIGH (ref 70–99)
Glucose-Capillary: 118 mg/dL — ABNORMAL HIGH (ref 70–99)
Glucose-Capillary: 118 mg/dL — ABNORMAL HIGH (ref 70–99)
Glucose-Capillary: 119 mg/dL — ABNORMAL HIGH (ref 70–99)
Glucose-Capillary: 121 mg/dL — ABNORMAL HIGH (ref 70–99)
Glucose-Capillary: 124 mg/dL — ABNORMAL HIGH (ref 70–99)

## 2019-10-26 LAB — CBC
HCT: 42.8 % (ref 36.0–46.0)
Hemoglobin: 12.8 g/dL (ref 12.0–15.0)
MCH: 30.5 pg (ref 26.0–34.0)
MCHC: 29.9 g/dL — ABNORMAL LOW (ref 30.0–36.0)
MCV: 101.9 fL — ABNORMAL HIGH (ref 80.0–100.0)
Platelets: 165 10*3/uL (ref 150–400)
RBC: 4.2 MIL/uL (ref 3.87–5.11)
RDW: 16.4 % — ABNORMAL HIGH (ref 11.5–15.5)
WBC: 10.6 10*3/uL — ABNORMAL HIGH (ref 4.0–10.5)
nRBC: 0 % (ref 0.0–0.2)

## 2019-10-26 LAB — CULTURE, BLOOD (ROUTINE X 2): Special Requests: ADEQUATE

## 2019-10-26 LAB — MAGNESIUM: Magnesium: 2.4 mg/dL (ref 1.7–2.4)

## 2019-10-26 LAB — PHOSPHORUS: Phosphorus: 3.2 mg/dL (ref 2.5–4.6)

## 2019-10-26 MED ORDER — SODIUM CHLORIDE 0.9 % IV SOLN: INTRAVENOUS | Status: DC | PRN

## 2019-10-26 MED ORDER — LOSARTAN POTASSIUM 50 MG PO TABS
25.0000 mg | ORAL_TABLET | Freq: Two times a day (BID) | ORAL | Status: DC
  Administered 2019-10-26 – 2019-10-27 (×2): 25 mg
  Filled 2019-10-26 (×3): qty 1

## 2019-10-26 MED ORDER — AMLODIPINE BESYLATE 5 MG PO TABS
5.0000 mg | ORAL_TABLET | Freq: Every day | ORAL | Status: DC
  Administered 2019-10-27: 10:00:00 5 mg
  Filled 2019-10-26 (×2): qty 1

## 2019-10-26 MED ORDER — PANTOPRAZOLE SODIUM 40 MG PO PACK
40.0000 mg | PACK | Freq: Every day | ORAL | Status: DC
  Administered 2019-10-27: 10:00:00 40 mg
  Filled 2019-10-26: qty 20

## 2019-10-26 MED ORDER — SODIUM CHLORIDE 0.9% FLUSH: 10.0000 mL | INTRAVENOUS | Status: DC | PRN

## 2019-10-26 MED ORDER — SODIUM CHLORIDE 0.9% FLUSH
10.0000 mL | Freq: Two times a day (BID) | INTRAVENOUS | Status: DC
  Administered 2019-10-26 – 2019-10-27 (×4): 10 mL

## 2019-10-26 MED ORDER — HYDRALAZINE HCL 25 MG PO TABS
25.0000 mg | ORAL_TABLET | Freq: Three times a day (TID) | ORAL | Status: DC
  Administered 2019-10-26 – 2019-10-27 (×2): 25 mg
  Filled 2019-10-26 (×2): qty 1

## 2019-10-26 NOTE — Progress Notes (Signed)
Patient's son called to let staff know the family has decided to proceed with full comfort care for the patient. Four family members, Elliot Gault (son), Teleshia Lemere (son), Elliona Doddridge (daughter), and Adonis Housekeeper (father) will be arriving at 10:00 am to speak with doctor prior to one-way extubation.  Jaclyn Shaggy RN

## 2019-10-26 NOTE — Progress Notes (Signed)
NAME:  Debra Huff, MRN:  [id number], DOB:  [birth year], LOS: 7 ADMISSION DATE:  Nov 03, 2019, CONSULTATION DATE:  10/26/19 REFERRING MD:  Cheral Marker, CHIEF COMPLAINT:  Hemorrhagic CVA   Brief History    Debra Huff is a 49 y.o. F with HTN for which she has not sought medical care or been taking medications for years per her sister.  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 Seen by neurology and neurosurgery and deemed not a surgical candidate.  Intubated and PCCM consulted for vent management.   Extremely hypertensive 269/136 and started on Cleviprex gtt.  Past Medical History  HTN  Significant Hospital Events   4/18 Admit to ICU, neurology primary 4/23 fever 102.26F but wbc down. UA dwith pyuria and bacteriuria 4/24 still febrile. Urine culture with GNR. CT head without change - >  31mm Left to right shift and Large Left basal ganglian Hge with IV extension. Ventriculomegaly +   Consults:  Neurosurgery  PCCM  Procedures:  4/19 ETT 4/19 R IJ CVC  Significant Diagnostic Tests:  10/19/19 - U tox - amphetamines + 10/24/19 CT head>> Large intraparenchymal hematoma centered in the left basal ganglia with subarachnoid extension and 6 mm rightward midlinesShift. Right lateral ventricle entrapment with dilatation of the atrium and temporal horn CTA head 4/19 > stable basal ganglia and corona radiata hemorrhage.  Stable IVH, stable 57mm MLS.  Stable dilatation of right temporal tip CT head 4/24 -   89mm Left to right shift and Large Left basal ganglian Hge with IV extension. Ventriculomegaly +   Micro Data:  4/26 > Sars-Cov-2>>negative  4/19 >  MRS PCR neg 4/23 >  blood =4/23 - cog neg staph x 1 4/23 > urine - 100K  E colii 4/23 > trach -  STAPHYLOCOCCUS AUREUS - MSSA  Antimicrobials:  4/23 ceftriaxone > 4/25 4/25 cefazolin  >>  Interim history/subjective:  No events overnight. Patient remains with poor neuro function.   Objective   Blood pressure (!) 94/52, pulse 79, temperature 99.3 F (37.4 C), temperature source Axillary, resp. rate 17, height 5\' 5"  (1.651 m), weight 100.7 kg, SpO2 99 %.    Vent Mode: CPAP;PSV FiO2 (%):  [40 %] 40 % Set Rate:  [18 bmp] 18 bmp Vt Set:  [450 mL] 450 mL PEEP:  [5 cmH20] 5 cmH20 Pressure Support:  [10 cmH20] 10 cmH20 Plateau Pressure:  [18 cmH20-22 cmH20] 18 cmH20   Intake/Output Summary (Last 24 hours) at 10/26/2019 1104 Last data filed at 10/26/2019 1000 Gross per 24 hour  Intake 1500.07 ml  Output 1625 ml  Net -124.93 ml   Filed Weights   10/24/19 0500 10/25/19 0500 10/26/19 0500  Weight: 102 kg 102 kg 100.7 kg      Physical Exam: General Appearance:  Adult female, on vent  HEENT: ETT/OG in place  Lungs: Clear breath sounds, no crackles/wheeze  Heart:  RRR, no MRG Abdomen:  Obese, active bowel sounds, soft  Genitalia / Rectal:  Intact  Extremities:  +2 edema to BUE  Skin:  Weeping laceration noted to right upper forearm  Neurologic: pupils intact, +cough/gag, withdrawals from pain, does not follow commands    Assessment & Plan:   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   -Evaluated by neurosurgery and deemed to not be  a surgical candidate. Plan -Per Neurology >> Suggest Long-term poor prognosis with ongoing family conversations  -Continue Frequent Neuro Checks  -Now off 3%. Goal NA 150-155 (Currently 157)   Respiratory failure/insufficeny in setting of above Plan -Continue Vent Support  >> Currently undergoing SBT however Neuro Status Barrier for Extubation  -VAP Bundle  -Trend CXR/ABG  Hypertensive Emergency  Plan -Remains off Cardene/Celeviprex. Overnight with some hypotension. Scheduled Labetalol stopped -Continues with Losartan, Hydralazine, and Norvasc with BP goal normotensive     VAP MSSA and E colii UTI Plan -Trend WBC and Fever Curve  -Continue ancef   Remaining per primary team.  Best practice:  Diet: NPO. Pain/Anxiety/Delirium protocol (if indicated): propofol gtt as needed (currently off). VAP protocol (if indicated): HOB at 30 degrees. DVT prophylaxis: No DVT prophylaxis indicated given ICH GI prophylaxis: Protonix. Glucose control: SSI if glucose consistently > 180. Mobility: NA. Code Status: Full code but family contemplating goals Family Communication: per neuro   Jovita Kussmaul, AGACNP-BC [facility]  Pgr: [phone]  PCCM Pgr: [phone]

## 2019-10-26 NOTE — Progress Notes (Signed)
STROKE TEAM PROGRESS NOTE   INTERVAL HISTORY Patient remains critically ill with poor neurological exam and on ventilatory support for respiratory failure.  Blood pressure adequately controlled.  Family supposed to have a meeting over the weekend to decide on goals of care.  Patient sister visited her last night. I called this morning and left a message on her ex-husband's voicemail to call me back. Vitals:   10/26/19 0600 10/26/19 0700 10/26/19 0800 10/26/19 0902  BP: (!) 114/58 (!) 114/59    Pulse: 77 75    Resp: 17 (!) 21    Temp:   99.3 F (37.4 C)   TempSrc:   Axillary   SpO2: 99% 98%  98%  Weight:      Height:        CBC:  Recent Labs  Lab 10/25/19 0343 10/26/19 0244  WBC 11.5* 10.6*  HGB 13.0 12.8  HCT 42.9 42.8  MCV 101.4* 101.9*  PLT 165 165    Basic Metabolic Panel:  Recent Labs  Lab 10/25/19 0343 10/25/19 0820 10/25/19 2140 10/26/19 0244  NA 158*   < > 157* 156*  K 3.6  --   --  3.6  CL 125*  --   --  127*  CO2 25  --   --  24  GLUCOSE 123*  --   --  144*  BUN 39*  --   --  41*  CREATININE 0.79  --   --  0.78  CALCIUM 8.7*  --   --  8.6*  MG 2.5*  --   --  2.4  PHOS 3.5  --   --  3.2   < > = values in this interval not displayed.   Lipid Panel:     Component Value Date/Time   CHOL 152 10/20/2019 0245   TRIG 108 10/23/2019 0604   HDL 46 10/20/2019 0245   CHOLHDL 3.3 10/20/2019 0245   VLDL 22 10/20/2019 0245   LDLCALC 84 10/20/2019 0245   HgbA1c:  Lab Results  Component Value Date   HGBA1C 5.7 (H) 10/20/2019   Urine Drug Screen:     Component Value Date/Time   LABOPIA NONE DETECTED 10/19/2019 0600   COCAINSCRNUR NONE DETECTED 10/19/2019 0600   LABBENZ NONE DETECTED 10/19/2019 0600   AMPHETMU POSITIVE (A) 10/19/2019 0600   THCU NONE DETECTED 10/19/2019 0600   LABBARB NONE DETECTED 10/19/2019 0600    Alcohol Level     Component Value Date/Time   ETH <10 10/19/2019 0057    IMAGING past 24 hours No results found.   PHYSICAL EXAM   Obese middle-aged lady who is intubated and not in distress. . Afebrile. Head is nontraumatic. Neck is supple without bruit.    Cardiac exam no murmur or gallop. Lungs are clear to auscultation. Distal pulses are well felt.  Temp:  [98.9 F (37.2 C)-100.2 F (37.9 C)] 99.3 F (37.4 C) (04/26 0800) Pulse Rate:  [64-80] 75 (04/26 0700) Resp:  [13-26] 21 (04/26 0700) BP: (106-157)/(56-76) 114/59 (04/26 0700) SpO2:  [95 %-100 %] 98 % (04/26 0902) FiO2 (%):  [40 %] 40 % (04/26 0902) Weight:  [100.7 kg] 100.7 kg (04/26 0500)  Neurological Exam - intubated not on sedation., eyes closed, not following commands.  Globally aphasic with forced eye opening, eyes in mid position, not blinking to visual threat, doll's eyes sluggish, not tracking, PERRL. Corneal reflex weak on the left, absent on the right, gag and cough present. Breathing  over the vent.  Facial  symmetry not able to test due to ET tube.  Tongue protrusion outside of mouth with edema. On pain stimulation, withdraw at least 3+/5 LUE and 3/5LLE, but 2/5 RLE but 0/5 RUE. DTR 1+ and bilateral positive babinski. Sensation, coordination and gait not tested.   ASSESSMENT/PLAN Debra Huff is a 49 y.o. female with uncontrolled HTN presenting with R sided weakness, R facial droop, diaphoreses, leaning to the R and unresponsive. Found to be twitching all over w/ pinpoint pupils and SBP > 260.   Stroke:  R large basal ganglia ICH w/ IVH, SAH and associated cerebral edema w/ subfalcine herniation, hemorrhage secondary to hypertension   CT head large L basal ganglia ICH w/ SAH and 58mm R midline shift. R lateral ventricle entrapment w/ dilatation of atrium and temporal horn.   Repeat CT head 4/19 mild extension large L basal ganglia ICH. Stable IVH. Stable ventriculomegaly. 92mm midline shift. No new abnormality.  CT head 4/20 slightly smaller L basal ganglia and corona radiata ICH. Stable IVH. Stable 8mm midline shift. Stable dilation R temporal  horn.    CTA head & neck No intracranial atherosclerosis. Atherosclerosis L ICA bifurcation and B cavernous ICAs.  CT head 4/21 unchanged hematoma and midline shift.  CT head 4/24 - No significant interval change in large left basal ganglia intraparenchymal hemorrhage with intraventricular extension. Associated ventriculomegaly with probable transependymal flow of CSF relatively unchanged as well. Stable 7 mm left-to-right shift.   2D Echo EF 60-65%  LDL 84  HgbA1c 5.7  SCDs for VTE prophylaxis  No antithrombotic prior to admission, now on No antithrombotic given hemorrhage   Therapy recommendations:  reconsult when stable for evaluation  Disposition:  Pending  ICH score = 3   Long-term neuro prognosis poor. Family meeting at pts father's house this weekend to discuss GOC. They will call with decision if made after the meeting.  Acute Respiratory Failure d/t ICH  Intubated in ED  CCM on board   On vent  Off sedation  Likely will need trach if ongoing aggressive care  Fever, Leukocytosis   Tmax 102.4->102.3->102.3->99.6->100.2  WBC 11.6->17.9->14.2->12.6->10.4->11.0->11.5  UA w/ bacteria, WBC 21-50  Check UCx - >100,000 col E-coli sensitive to Ceftriaxone  CXR stable 4/23  Tylenol and ice packs  Resp Cx - ABUNDANT STAPHYLOCOCCUS AUREUS -> ceftriaxone changed to Ancef today by CCM  Blood Cx 4/24 - Methicillin (oxacillin) susceptible coagulase negative staphylococcus.  Rocephin 4/23>> Per pharmacist - no change in antibiotic therapy indicated with blood culture results.  CXR 4/24 - Support apparatus as above. No other acute interval changes.  Cerebral Edema  NSG consulted [name]) no role for surgical intervention on admission. Following for possible EVD.  CT head 4/19 6->73mm midline shift   CT head 4/20 stable 31mm midline shift  CT head 4/21 stable 7 mm midline shift  CT head 4/24 - No significant interval change in large left basal ganglia  intraparenchymal hemorrhage with intraventricular extension. Associated ventriculomegaly with probable transependymal flow of CSF relatively unchanged as well. Stable 7 mm left-to-right shift.   On 3% @ 50/h -> NS @ 50 -> off   23.4% saline x 1  Goal Na 150-155  Na 139->...->162->160->159->158  Na Q6h  Hypertensive Emergency  Home meds:  None  SBP > 260 on arrival . SBP goal < 160 (Sunday SBP 130's to 140's after increasing Labetalol dose yesterday)  On IV Cleviprex  Off labetalol gtt  Norvasc 10 (max dose)   Labetalol 200 tid->300 tid (  Max dose 2400 mg per day) (pt not bradycardic) Will increase to 400 mg tid  Losartan 50 bid (max dose)  Hydralazine 50 tid -> 100 tid (max dose) . Long-term BP goal normotensive  Hyperlipidemia  LDL 84, goal LDL < 70   on no statin PTA  Add statin on discharge  Dysphagia . Secondary to stroke . NPO . On tube feedings @ 40cc w/ prostat . Speech on board   Other Stroke Risk Factors  UDS:  Amphetamines POSITIVE - cessation education will be provided  Obesity, Body mass index is 36.94 kg/m., recommend weight loss, diet and exercise as appropriate   Other Active Problems  Polycythemia Hb 15.5->16.3->13.5->12.1->12.3->12.4->12.9->13.0 resolved  Hypokalemia 3.4->3.7->3.8->3.6->3.6 - supplemented - resolved  Hypophosphatemia 2.2->1.9->2.5->2.9->3.7->3.5- supplemented - resolved  Urinary retention, foley placed  Constipation. Bowel regimen added.  Hospital day # 7 Patient presented with a large intracerebral hemorrhage with severe cytotoxic edema midline shift intraventricular extension with very poor neurological exam which has persisted.  Prognosis for survival and making meaningful recovery is negligible.  Family apparently understands this and was supposed to have a family meeting over the weekend to discuss goals of care.  I have called and left a message on her ex-husband voicemail who is the decision maker.  Will discuss  with family when available withdrawal of care which would be appropriate.  Discussed with care team and critical care team This patient is critically ill and at significant risk of neurological worsening, death and care requires constant monitoring of vital signs, hemodynamics,respiratory and cardiac monitoring,review of multiple databases, neurological assessment, discussion with family, other specialists and medical decision making of high complexity.I   I spent 35 minutes of neurocritical care time in the care of this patient.  Antony Contras, MD    To contact Stroke Continuity provider, please refer to [email or web address] After hours, contact General Neurology

## 2019-10-26 NOTE — Social Work (Signed)
CSW was unable to complete sbirt due to pt being on the ventilator. CSW may attempt to complete at more appropriate time.   Emillie Chasen, LCSWA, LCASA Clinical Social Worker [phone]    

## 2019-10-27 ENCOUNTER — Encounter (HOSPITAL_COMMUNITY): Admission: EM | Disposition: E | Payer: Self-pay | Source: Home / Self Care | Attending: Neurology

## 2019-10-27 ENCOUNTER — Other Ambulatory Visit (HOSPITAL_COMMUNITY): Payer: Self-pay

## 2019-10-27 ENCOUNTER — Inpatient Hospital Stay (HOSPITAL_COMMUNITY): Payer: Self-pay

## 2019-10-27 DIAGNOSIS — G935 Compression of brain: Principal | ICD-10-CM

## 2019-10-27 HISTORY — PX: LEFT HEART CATH AND CORONARY ANGIOGRAPHY: CATH118249

## 2019-10-27 LAB — BASIC METABOLIC PANEL
Anion gap: 8 (ref 5–15)
BUN: 37 mg/dL — ABNORMAL HIGH (ref 6–20)
CO2: 26 mmol/L (ref 22–32)
Calcium: 8.8 mg/dL — ABNORMAL LOW (ref 8.9–10.3)
Chloride: 119 mmol/L — ABNORMAL HIGH (ref 98–111)
Creatinine, Ser: 0.71 mg/dL (ref 0.44–1.00)
GFR calc Af Amer: >60 mL/min (ref >60)
GFR calc non Af Amer: >60 mL/min (ref >60)
Glucose, Bld: 131 mg/dL — ABNORMAL HIGH (ref 70–99)
Potassium: 3.7 mmol/L (ref 3.5–5.1)
Sodium: 153 mmol/L — ABNORMAL HIGH (ref 135–145)

## 2019-10-27 LAB — CBC
HCT: 46.3 % — ABNORMAL HIGH (ref 36.0–46.0)
HCT: 45.9 % (ref 36.0–46.0)
Hemoglobin: 14.2 g/dL (ref 12.0–15.0)
Hemoglobin: 13.8 g/dL (ref 12.0–15.0)
MCH: 30.9 pg (ref 26.0–34.0)
MCH: 30.7 pg (ref 26.0–34.0)
MCHC: 30.7 g/dL (ref 30.0–36.0)
MCHC: 30.1 g/dL (ref 30.0–36.0)
MCV: 100.9 fL — ABNORMAL HIGH (ref 80.0–100.0)
MCV: 102.2 fL — ABNORMAL HIGH (ref 80.0–100.0)
Platelets: 176 10*3/uL (ref 150–400)
Platelets: 219 10*3/uL (ref 150–400)
RBC: 4.59 MIL/uL (ref 3.87–5.11)
RBC: 4.49 MIL/uL (ref 3.87–5.11)
RDW: 16.2 % — ABNORMAL HIGH (ref 11.5–15.5)
RDW: 15.9 % — ABNORMAL HIGH (ref 11.5–15.5)
WBC: 10.4 10*3/uL (ref 4.0–10.5)
WBC: 29.5 10*3/uL — ABNORMAL HIGH (ref 4.0–10.5)
nRBC: 0 % (ref 0.0–0.2)
nRBC: 0.1 % (ref 0.0–0.2)

## 2019-10-27 LAB — COMPREHENSIVE METABOLIC PANEL
ALT: 23 U/L (ref 0–44)
AST: 39 U/L (ref 15–41)
Albumin: 2.2 g/dL — ABNORMAL LOW (ref 3.5–5.0)
Alkaline Phosphatase: 57 U/L (ref 38–126)
Anion gap: 11 (ref 5–15)
BUN: 48 mg/dL — ABNORMAL HIGH (ref 6–20)
CO2: 23 mmol/L (ref 22–32)
Calcium: 8.4 mg/dL — ABNORMAL LOW (ref 8.9–10.3)
Chloride: 120 mmol/L — ABNORMAL HIGH (ref 98–111)
Creatinine, Ser: 1.44 mg/dL — ABNORMAL HIGH (ref 0.44–1.00)
GFR calc Af Amer: 49 mL/min — ABNORMAL LOW (ref >60)
GFR calc non Af Amer: 43 mL/min — ABNORMAL LOW (ref >60)
Glucose, Bld: 123 mg/dL — ABNORMAL HIGH (ref 70–99)
Potassium: 3.5 mmol/L (ref 3.5–5.1)
Sodium: 154 mmol/L — ABNORMAL HIGH (ref 135–145)
Total Bilirubin: 0.6 mg/dL (ref 0.3–1.2)
Total Protein: 5.9 g/dL — ABNORMAL LOW (ref 6.5–8.1)

## 2019-10-27 LAB — POCT I-STAT 7, (LYTES, BLD GAS, ICA,H+H)
Acid-Base Excess: 1 mmol/L (ref 0.0–2.0)
Acid-base deficit: 1 mmol/L (ref 0.0–2.0)
Acid-base deficit: 4 mmol/L — ABNORMAL HIGH (ref 0.0–2.0)
Bicarbonate: 22.9 mmol/L (ref 20.0–28.0)
Bicarbonate: 27.1 mmol/L (ref 20.0–28.0)
Bicarbonate: 29.9 mmol/L — ABNORMAL HIGH (ref 20.0–28.0)
Calcium, Ion: 1.27 mmol/L (ref 1.15–1.40)
Calcium, Ion: 1.27 mmol/L (ref 1.15–1.40)
Calcium, Ion: 1.32 mmol/L (ref 1.15–1.40)
HCT: 39 % (ref 36.0–46.0)
HCT: 41 % (ref 36.0–46.0)
HCT: 44 % (ref 36.0–46.0)
Hemoglobin: 13.3 g/dL (ref 12.0–15.0)
Hemoglobin: 13.9 g/dL (ref 12.0–15.0)
Hemoglobin: 15 g/dL (ref 12.0–15.0)
O2 Saturation: 100 %
O2 Saturation: 100 %
O2 Saturation: 98 %
Patient temperature: 100
Patient temperature: 101.4
Patient temperature: 101.4
Potassium: 3.1 mmol/L — ABNORMAL LOW (ref 3.5–5.1)
Potassium: 3.5 mmol/L (ref 3.5–5.1)
Potassium: 3.6 mmol/L (ref 3.5–5.1)
Sodium: 155 mmol/L — ABNORMAL HIGH (ref 135–145)
Sodium: 156 mmol/L — ABNORMAL HIGH (ref 135–145)
Sodium: 156 mmol/L — ABNORMAL HIGH (ref 135–145)
TCO2: 24 mmol/L (ref 22–32)
TCO2: 28 mmol/L (ref 22–32)
TCO2: 32 mmol/L (ref 22–32)
pCO2 arterial: 48.8 mmHg — ABNORMAL HIGH (ref 32.0–48.0)
pCO2 arterial: 49.2 mmHg — ABNORMAL HIGH (ref 32.0–48.0)
pCO2 arterial: 85.9 mmHg (ref 32.0–48.0)
pH, Arterial: 7.158 — CL (ref 7.350–7.450)
pH, Arterial: 7.28 — ABNORMAL LOW (ref 7.350–7.450)
pH, Arterial: 7.36 (ref 7.350–7.450)
pO2, Arterial: 153 mmHg — ABNORMAL HIGH (ref 83.0–108.0)
pO2, Arterial: 205 mmHg — ABNORMAL HIGH (ref 83.0–108.0)
pO2, Arterial: 416 mmHg — ABNORMAL HIGH (ref 83.0–108.0)

## 2019-10-27 LAB — DIFFERENTIAL
Abs Immature Granulocytes: 0 10*3/uL (ref 0.00–0.07)
Basophils Absolute: 0 10*3/uL (ref 0.0–0.1)
Basophils Relative: 0 %
Eosinophils Absolute: 0 10*3/uL (ref 0.0–0.5)
Eosinophils Relative: 0 %
Lymphocytes Relative: 4 %
Lymphs Abs: 1.2 10*3/uL (ref 0.7–4.0)
Monocytes Absolute: 1.5 10*3/uL — ABNORMAL HIGH (ref 0.1–1.0)
Monocytes Relative: 5 %
Neutro Abs: 26.8 10*3/uL — ABNORMAL HIGH (ref 1.7–7.7)
Neutrophils Relative %: 91 %
nRBC: 0 /100 WBC

## 2019-10-27 LAB — CK TOTAL AND CKMB (NOT AT ARMC)
CK, MB: 16.2 ng/mL — ABNORMAL HIGH (ref 0.5–5.0)
Relative Index: 5 — ABNORMAL HIGH (ref 0.0–2.5)
Total CK: 321 U/L — ABNORMAL HIGH (ref 38–234)

## 2019-10-27 LAB — URINALYSIS, ROUTINE W REFLEX MICROSCOPIC
Bilirubin Urine: NEGATIVE
Glucose, UA: NEGATIVE mg/dL
Hgb urine dipstick: NEGATIVE
Ketones, ur: NEGATIVE mg/dL
Leukocytes,Ua: NEGATIVE
Nitrite: NEGATIVE
Protein, ur: NEGATIVE mg/dL
Specific Gravity, Urine: 1.02 (ref 1.005–1.030)
pH: 5 (ref 5.0–8.0)

## 2019-10-27 LAB — CULTURE, RESPIRATORY W GRAM STAIN

## 2019-10-27 LAB — APTT: aPTT: 27 seconds (ref 24–36)

## 2019-10-27 LAB — PHOSPHORUS
Phosphorus: 3 mg/dL (ref 2.5–4.6)
Phosphorus: 4.6 mg/dL (ref 2.5–4.6)

## 2019-10-27 LAB — PROTIME-INR
INR: 1.2 (ref 0.8–1.2)
Prothrombin Time: 15.1 seconds (ref 11.4–15.2)

## 2019-10-27 LAB — TROPONIN I (HIGH SENSITIVITY): Troponin I (High Sensitivity): 3247 ng/L (ref <18)

## 2019-10-27 LAB — GLUCOSE, CAPILLARY
Glucose-Capillary: 122 mg/dL — ABNORMAL HIGH (ref 70–99)
Glucose-Capillary: 139 mg/dL — ABNORMAL HIGH (ref 70–99)
Glucose-Capillary: 120 mg/dL — ABNORMAL HIGH (ref 70–99)
Glucose-Capillary: 133 mg/dL — ABNORMAL HIGH (ref 70–99)

## 2019-10-27 LAB — MAGNESIUM
Magnesium: 2.4 mg/dL (ref 1.7–2.4)
Magnesium: 2.4 mg/dL (ref 1.7–2.4)

## 2019-10-27 LAB — BILIRUBIN, DIRECT: Bilirubin, Direct: 0.2 mg/dL (ref 0.0–0.2)

## 2019-10-27 LAB — AMYLASE: Amylase: 122 U/L — ABNORMAL HIGH (ref 28–100)

## 2019-10-27 LAB — FIBRINOGEN: Fibrinogen: 598 mg/dL — ABNORMAL HIGH (ref 210–475)

## 2019-10-27 LAB — LIPASE, BLOOD: Lipase: 14 U/L (ref 11–51)

## 2019-10-27 LAB — HEMOGLOBIN A1C
Hgb A1c MFr Bld: 5.8 % — ABNORMAL HIGH (ref 4.8–5.6)
Mean Plasma Glucose: 119.76 mg/dL

## 2019-10-27 LAB — ABO/RH: ABO/RH(D): O POS

## 2019-10-27 LAB — LACTATE DEHYDROGENASE: LDH: 259 U/L — ABNORMAL HIGH (ref 98–192)

## 2019-10-27 LAB — SODIUM: Sodium: 154 mmol/L — ABNORMAL HIGH (ref 135–145)

## 2019-10-27 SURGERY — LEFT HEART CATH AND CORONARY ANGIOGRAPHY
Anesthesia: LOCAL

## 2019-10-27 MED ORDER — LIDOCAINE HCL (PF) 1 % IJ SOLN
INTRAMUSCULAR | Status: DC | PRN
  Administered 2019-10-27: 20 mL
  Administered 2019-10-27: 2 mL

## 2019-10-27 MED ORDER — SODIUM CHLORIDE 0.9% FLUSH
3.0000 mL | Freq: Two times a day (BID) | INTRAVENOUS | Status: DC
  Administered 2019-10-27 – 2019-10-28 (×2): 3 mL via INTRAVENOUS

## 2019-10-27 MED ORDER — SODIUM CHLORIDE 0.9 % IV SOLN
10.0000 ug/h | INTRAVENOUS | Status: DC
  Administered 2019-10-27: 22:00:00 10 ug/h via INTRAVENOUS
  Administered 2019-10-28 – 2019-10-29 (×3): 20 ug/h via INTRAVENOUS
  Filled 2019-10-27 (×7): qty 10

## 2019-10-27 MED ORDER — LORAZEPAM 2 MG/ML IJ SOLN: 1.0000 mg | INTRAMUSCULAR | Status: DC | PRN

## 2019-10-27 MED ORDER — LACTATED RINGERS IV SOLN: INTRAVENOUS | Status: DC

## 2019-10-27 MED ORDER — NOREPINEPHRINE 16 MG/250ML-% IV SOLN
0.0000 ug/min | INTRAVENOUS | Status: DC
  Administered 2019-10-27: 23:00:00 30 ug/min via INTRAVENOUS
  Filled 2019-10-27: qty 250

## 2019-10-27 MED ORDER — GLYCOPYRROLATE 0.2 MG/ML IJ SOLN: 0.2000 mg | INTRAMUSCULAR | Status: DC | PRN

## 2019-10-27 MED ORDER — SODIUM CHLORIDE 0.9 % IV SOLN: 250.0000 mL | INTRAVENOUS | Status: DC | PRN

## 2019-10-27 MED ORDER — SODIUM CHLORIDE 0.9% FLUSH: 3.0000 mL | INTRAVENOUS | Status: DC | PRN

## 2019-10-27 MED ORDER — INSULIN ASPART 100 UNIT/ML ~~LOC~~ SOLN
20.0000 [IU] | Freq: Once | SUBCUTANEOUS | Status: AC
  Administered 2019-10-27: 22:00:00 20 [IU] via SUBCUTANEOUS

## 2019-10-27 MED ORDER — LORAZEPAM 1 MG PO TABS: 1.0000 mg | ORAL_TABLET | ORAL | Status: DC | PRN

## 2019-10-27 MED ORDER — SODIUM CHLORIDE 0.9% FLUSH: 3.0000 mL | Freq: Two times a day (BID) | INTRAVENOUS | Status: DC

## 2019-10-27 MED ORDER — DEXTROSE 50 % IV SOLN
50.0000 mL | Freq: Once | INTRAVENOUS | Status: AC
  Administered 2019-10-27: 22:00:00 50 mL via INTRAVENOUS
  Filled 2019-10-27: qty 50

## 2019-10-27 MED ORDER — LEVOTHYROXINE SODIUM 100 MCG/5ML IV SOLN
20.0000 ug | Freq: Once | INTRAVENOUS | Status: AC
  Administered 2019-10-27: 22:00:00 20 ug via INTRAVENOUS
  Filled 2019-10-27: qty 5

## 2019-10-27 MED ORDER — NOREPINEPHRINE 4 MG/250ML-% IV SOLN
0.0000 ug/min | INTRAVENOUS | Status: DC
  Administered 2019-10-27: 40 ug/min via INTRAVENOUS
  Administered 2019-10-27: 15:00:00 10 ug/min via INTRAVENOUS
  Filled 2019-10-27: qty 250
  Filled 2019-10-27 (×2): qty 500
  Filled 2019-10-27: qty 250

## 2019-10-27 MED ORDER — CEFAZOLIN SODIUM-DEXTROSE 2-4 GM/100ML-% IV SOLN
2.0000 g | Freq: Three times a day (TID) | INTRAVENOUS | Status: DC
  Administered 2019-10-27 – 2019-10-29 (×6): 2 g via INTRAVENOUS
  Filled 2019-10-27 (×6): qty 100

## 2019-10-27 MED ORDER — LORAZEPAM 2 MG/ML PO CONC: 1.0000 mg | ORAL | Status: DC | PRN

## 2019-10-27 MED ORDER — IOHEXOL 350 MG/ML SOLN
INTRAVENOUS | Status: DC | PRN
  Administered 2019-10-27: 40 mL

## 2019-10-27 MED ORDER — SODIUM CHLORIDE 0.9 % WEIGHT BASED INFUSION: 1.0000 mL/kg/h | INTRAVENOUS | Status: DC

## 2019-10-27 MED ORDER — FENTANYL 2500MCG IN NS 250ML (10MCG/ML) PREMIX INFUSION
0.0000 ug/h | INTRAVENOUS | Status: DC
  Administered 2019-10-27: 13:00:00 50 ug/h via INTRAVENOUS
  Filled 2019-10-27: qty 250

## 2019-10-27 MED ORDER — SODIUM CHLORIDE 0.9 % IV SOLN: INTRAVENOUS | Status: DC

## 2019-10-27 MED ORDER — GLYCOPYRROLATE 1 MG PO TABS
1.0000 mg | ORAL_TABLET | ORAL | Status: DC | PRN
  Filled 2019-10-27: qty 1

## 2019-10-27 MED ORDER — SODIUM CHLORIDE 0.9 % IV SOLN
2000.0000 mg | Freq: Once | INTRAVENOUS | Status: AC
  Administered 2019-10-27: 22:00:00 2000 mg via INTRAVENOUS
  Filled 2019-10-27: qty 16

## 2019-10-27 MED ORDER — HEPARIN (PORCINE) IN NACL 1000-0.9 UT/500ML-% IV SOLN
INTRAVENOUS | Status: DC | PRN
  Administered 2019-10-27 (×2): 500 mL

## 2019-10-27 MED ORDER — SODIUM CHLORIDE 0.9 % IV SOLN
1000.0000 mg | Freq: Once | INTRAVENOUS | Status: DC
  Filled 2019-10-27: qty 8

## 2019-10-27 MED ORDER — VASOPRESSIN 20 UNIT/ML IV SOLN
0.0300 [IU]/min | INTRAVENOUS | Status: DC
  Administered 2019-10-27: 0.03 [IU]/min via INTRAVENOUS
  Filled 2019-10-27 (×3): qty 2

## 2019-10-27 SURGICAL SUPPLY — 11 items
CATH INFINITI 5 FR 3DRC (CATHETERS) ×1 IMPLANT
CATH INFINITI 5FR MULTPACK ANG (CATHETERS) ×1 IMPLANT
CLOSURE MYNX CONTROL 5F (Vascular Products) ×1 IMPLANT
GLIDESHEATH SLEND A-KIT 6F 22G (SHEATH) ×1 IMPLANT
HOVERMATT SINGLE USE (MISCELLANEOUS) ×1 IMPLANT
KIT HEART LEFT (KITS) ×2 IMPLANT
PACK CARDIAC CATHETERIZATION (CUSTOM PROCEDURE TRAY) ×2 IMPLANT
SHEATH PINNACLE 5F 10CM (SHEATH) ×1 IMPLANT
TRANSDUCER W/STOPCOCK (MISCELLANEOUS) ×2 IMPLANT
TUBING CIL FLEX 10 FLL-RA (TUBING) ×2 IMPLANT
WIRE EMERALD 3MM-J .035X150CM (WIRE) ×1 IMPLANT

## 2019-10-28 ENCOUNTER — Other Ambulatory Visit (HOSPITAL_COMMUNITY): Payer: Self-pay

## 2019-10-28 DIAGNOSIS — I6389 Other cerebral infarction: Secondary | ICD-10-CM

## 2019-10-28 LAB — POCT I-STAT 7, (LYTES, BLD GAS, ICA,H+H)
Acid-Base Excess: 0 mmol/L (ref 0.0–2.0)
Acid-base deficit: 2 mmol/L (ref 0.0–2.0)
Acid-base deficit: 1 mmol/L (ref 0.0–2.0)
Acid-base deficit: 2 mmol/L (ref 0.0–2.0)
Acid-base deficit: 1 mmol/L (ref 0.0–2.0)
Acid-base deficit: 2 mmol/L (ref 0.0–2.0)
Acid-base deficit: 3 mmol/L — ABNORMAL HIGH (ref 0.0–2.0)
Bicarbonate: 24.1 mmol/L (ref 20.0–28.0)
Bicarbonate: 24.8 mmol/L (ref 20.0–28.0)
Bicarbonate: 23.1 mmol/L (ref 20.0–28.0)
Bicarbonate: 22.7 mmol/L (ref 20.0–28.0)
Bicarbonate: 24.2 mmol/L (ref 20.0–28.0)
Bicarbonate: 21.7 mmol/L (ref 20.0–28.0)
Bicarbonate: 22.9 mmol/L (ref 20.0–28.0)
Calcium, Ion: 1.24 mmol/L (ref 1.15–1.40)
Calcium, Ion: 1.23 mmol/L (ref 1.15–1.40)
Calcium, Ion: 1.26 mmol/L (ref 1.15–1.40)
Calcium, Ion: 1.28 mmol/L (ref 1.15–1.40)
Calcium, Ion: 1.32 mmol/L (ref 1.15–1.40)
Calcium, Ion: 1.22 mmol/L (ref 1.15–1.40)
Calcium, Ion: 1.34 mmol/L (ref 1.15–1.40)
HCT: 41 % (ref 36.0–46.0)
HCT: 35 % — ABNORMAL LOW (ref 36.0–46.0)
HCT: 35 % — ABNORMAL LOW (ref 36.0–46.0)
HCT: 34 % — ABNORMAL LOW (ref 36.0–46.0)
HCT: 32 % — ABNORMAL LOW (ref 36.0–46.0)
HCT: 31 % — ABNORMAL LOW (ref 36.0–46.0)
HCT: 33 % — ABNORMAL LOW (ref 36.0–46.0)
Hemoglobin: 13.9 g/dL (ref 12.0–15.0)
Hemoglobin: 11.9 g/dL — ABNORMAL LOW (ref 12.0–15.0)
Hemoglobin: 11.9 g/dL — ABNORMAL LOW (ref 12.0–15.0)
Hemoglobin: 11.6 g/dL — ABNORMAL LOW (ref 12.0–15.0)
Hemoglobin: 10.9 g/dL — ABNORMAL LOW (ref 12.0–15.0)
Hemoglobin: 10.5 g/dL — ABNORMAL LOW (ref 12.0–15.0)
Hemoglobin: 11.2 g/dL — ABNORMAL LOW (ref 12.0–15.0)
O2 Saturation: 96 %
O2 Saturation: 100 %
O2 Saturation: 97 %
O2 Saturation: 97 %
O2 Saturation: 100 %
O2 Saturation: 98 %
O2 Saturation: 100 %
Patient temperature: 99.6
Patient temperature: 99.6
Patient temperature: 97.8
Patient temperature: 97.8
Patient temperature: 96.2
Patient temperature: 97.5
Patient temperature: 97.5
Potassium: 3.7 mmol/L (ref 3.5–5.1)
Potassium: 3.6 mmol/L (ref 3.5–5.1)
Potassium: 3.4 mmol/L — ABNORMAL LOW (ref 3.5–5.1)
Potassium: 3.3 mmol/L — ABNORMAL LOW (ref 3.5–5.1)
Potassium: 2.8 mmol/L — ABNORMAL LOW (ref 3.5–5.1)
Potassium: 3 mmol/L — ABNORMAL LOW (ref 3.5–5.1)
Potassium: 3.3 mmol/L — ABNORMAL LOW (ref 3.5–5.1)
Sodium: 156 mmol/L — ABNORMAL HIGH (ref 135–145)
Sodium: 154 mmol/L — ABNORMAL HIGH (ref 135–145)
Sodium: 155 mmol/L — ABNORMAL HIGH (ref 135–145)
Sodium: 155 mmol/L — ABNORMAL HIGH (ref 135–145)
Sodium: 155 mmol/L — ABNORMAL HIGH (ref 135–145)
Sodium: 155 mmol/L — ABNORMAL HIGH (ref 135–145)
Sodium: 154 mmol/L — ABNORMAL HIGH (ref 135–145)
TCO2: 25 mmol/L (ref 22–32)
TCO2: 26 mmol/L (ref 22–32)
TCO2: 24 mmol/L (ref 22–32)
TCO2: 24 mmol/L (ref 22–32)
TCO2: 25 mmol/L (ref 22–32)
TCO2: 23 mmol/L (ref 22–32)
TCO2: 24 mmol/L (ref 22–32)
pCO2 arterial: 44.1 mmHg (ref 32.0–48.0)
pCO2 arterial: 41.8 mmHg (ref 32.0–48.0)
pCO2 arterial: 36 mmHg (ref 32.0–48.0)
pCO2 arterial: 37.9 mmHg (ref 32.0–48.0)
pCO2 arterial: 39.8 mmHg (ref 32.0–48.0)
pCO2 arterial: 32.6 mmHg (ref 32.0–48.0)
pCO2 arterial: 40.3 mmHg (ref 32.0–48.0)
pH, Arterial: 7.347 — ABNORMAL LOW (ref 7.350–7.450)
pH, Arterial: 7.384 (ref 7.350–7.450)
pH, Arterial: 7.414 (ref 7.350–7.450)
pH, Arterial: 7.383 (ref 7.350–7.450)
pH, Arterial: 7.385 (ref 7.350–7.450)
pH, Arterial: 7.428 (ref 7.350–7.450)
pH, Arterial: 7.359 (ref 7.350–7.450)
pO2, Arterial: 88 mmHg (ref 83.0–108.0)
pO2, Arterial: 312 mmHg — ABNORMAL HIGH (ref 83.0–108.0)
pO2, Arterial: 83 mmHg (ref 83.0–108.0)
pO2, Arterial: 90 mmHg (ref 83.0–108.0)
pO2, Arterial: 254 mmHg — ABNORMAL HIGH (ref 83.0–108.0)
pO2, Arterial: 98 mmHg (ref 83.0–108.0)
pO2, Arterial: 254 mmHg — ABNORMAL HIGH (ref 83.0–108.0)

## 2019-10-28 LAB — COMPREHENSIVE METABOLIC PANEL
ALT: 21 U/L (ref 0–44)
ALT: 25 U/L (ref 0–44)
AST: 30 U/L (ref 15–41)
AST: 33 U/L (ref 15–41)
Albumin: 1.8 g/dL — ABNORMAL LOW (ref 3.5–5.0)
Albumin: 1.7 g/dL — ABNORMAL LOW (ref 3.5–5.0)
Alkaline Phosphatase: 51 U/L (ref 38–126)
Alkaline Phosphatase: 52 U/L (ref 38–126)
Anion gap: 12 (ref 5–15)
Anion gap: 10 (ref 5–15)
BUN: 48 mg/dL — ABNORMAL HIGH (ref 6–20)
BUN: 55 mg/dL — ABNORMAL HIGH (ref 6–20)
CO2: 21 mmol/L — ABNORMAL LOW (ref 22–32)
CO2: 21 mmol/L — ABNORMAL LOW (ref 22–32)
Calcium: 8.3 mg/dL — ABNORMAL LOW (ref 8.9–10.3)
Calcium: 8.4 mg/dL — ABNORMAL LOW (ref 8.9–10.3)
Chloride: 122 mmol/L — ABNORMAL HIGH (ref 98–111)
Chloride: 124 mmol/L — ABNORMAL HIGH (ref 98–111)
Creatinine, Ser: 1.07 mg/dL — ABNORMAL HIGH (ref 0.44–1.00)
Creatinine, Ser: 0.9 mg/dL (ref 0.44–1.00)
GFR calc Af Amer: >60 mL/min (ref >60)
GFR calc Af Amer: >60 mL/min (ref >60)
GFR calc non Af Amer: >60 mL/min (ref >60)
GFR calc non Af Amer: >60 mL/min (ref >60)
Glucose, Bld: 203 mg/dL — ABNORMAL HIGH (ref 70–99)
Glucose, Bld: 208 mg/dL — ABNORMAL HIGH (ref 70–99)
Potassium: 3.5 mmol/L (ref 3.5–5.1)
Potassium: 2.9 mmol/L — ABNORMAL LOW (ref 3.5–5.1)
Sodium: 155 mmol/L — ABNORMAL HIGH (ref 135–145)
Sodium: 155 mmol/L — ABNORMAL HIGH (ref 135–145)
Total Bilirubin: 0.6 mg/dL (ref 0.3–1.2)
Total Bilirubin: 0.5 mg/dL (ref 0.3–1.2)
Total Protein: 5.2 g/dL — ABNORMAL LOW (ref 6.5–8.1)
Total Protein: 5 g/dL — ABNORMAL LOW (ref 6.5–8.1)

## 2019-10-28 LAB — URINALYSIS, ROUTINE W REFLEX MICROSCOPIC
Bilirubin Urine: NEGATIVE
Glucose, UA: 50 mg/dL — AB
Hgb urine dipstick: NEGATIVE
Ketones, ur: NEGATIVE mg/dL
Leukocytes,Ua: NEGATIVE
Nitrite: NEGATIVE
Protein, ur: 30 mg/dL — AB
Specific Gravity, Urine: 1.032 — ABNORMAL HIGH (ref 1.005–1.030)
pH: 5 (ref 5.0–8.0)

## 2019-10-28 LAB — PROTIME-INR
INR: 1.3 — ABNORMAL HIGH (ref 0.8–1.2)
Prothrombin Time: 16 seconds — ABNORMAL HIGH (ref 11.4–15.2)

## 2019-10-28 LAB — CK TOTAL AND CKMB (NOT AT ARMC)
CK, MB: 13.9 ng/mL — ABNORMAL HIGH (ref 0.5–5.0)
Relative Index: 6 — ABNORMAL HIGH (ref 0.0–2.5)
Total CK: 232 U/L (ref 38–234)

## 2019-10-28 LAB — CULTURE, BLOOD (ROUTINE X 2)
Culture: NO GROWTH
Special Requests: ADEQUATE

## 2019-10-28 LAB — CBC
HCT: 40.8 % (ref 36.0–46.0)
Hemoglobin: 12.4 g/dL (ref 12.0–15.0)
MCH: 30.9 pg (ref 26.0–34.0)
MCHC: 30.4 g/dL (ref 30.0–36.0)
MCV: 101.7 fL — ABNORMAL HIGH (ref 80.0–100.0)
Platelets: 167 10*3/uL (ref 150–400)
RBC: 4.01 MIL/uL (ref 3.87–5.11)
RDW: 15.7 % — ABNORMAL HIGH (ref 11.5–15.5)
WBC: 18.4 10*3/uL — ABNORMAL HIGH (ref 4.0–10.5)
nRBC: 0 % (ref 0.0–0.2)

## 2019-10-28 LAB — GLUCOSE, CAPILLARY
Glucose-Capillary: 164 mg/dL — ABNORMAL HIGH (ref 70–99)
Glucose-Capillary: 175 mg/dL — ABNORMAL HIGH (ref 70–99)
Glucose-Capillary: 176 mg/dL — ABNORMAL HIGH (ref 70–99)
Glucose-Capillary: 181 mg/dL — ABNORMAL HIGH (ref 70–99)
Glucose-Capillary: 187 mg/dL — ABNORMAL HIGH (ref 70–99)
Glucose-Capillary: 201 mg/dL — ABNORMAL HIGH (ref 70–99)

## 2019-10-28 LAB — URINE CULTURE: Culture: NO GROWTH

## 2019-10-28 LAB — PHOSPHORUS: Phosphorus: 3.4 mg/dL (ref 2.5–4.6)

## 2019-10-28 LAB — MAGNESIUM: Magnesium: 2.4 mg/dL (ref 1.7–2.4)

## 2019-10-28 MED ORDER — SODIUM CHLORIDE 0.9% FLUSH: 10.0000 mL | INTRAVENOUS | Status: DC | PRN

## 2019-10-28 MED ORDER — LIDOCAINE HCL (PF) 1 % IJ SOLN
INTRAMUSCULAR | Status: AC
  Filled 2019-10-28: qty 30

## 2019-10-28 MED ORDER — POTASSIUM CHLORIDE 10 MEQ/50ML IV SOLN
10.0000 meq | INTRAVENOUS | Status: AC
  Administered 2019-10-28 (×4): 10 meq via INTRAVENOUS
  Filled 2019-10-28 (×4): qty 50

## 2019-10-28 MED ORDER — SODIUM CHLORIDE 0.9% FLUSH
10.0000 mL | Freq: Two times a day (BID) | INTRAVENOUS | Status: DC
  Administered 2019-10-28: 10:00:00 10 mL
  Administered 2019-10-28: 22:00:00 30 mL

## 2019-10-28 MED ORDER — ALBUMIN HUMAN 5 % IV SOLN
25.0000 g | Freq: Once | INTRAVENOUS | Status: AC
  Administered 2019-10-28: 22:00:00 12.5 g via INTRAVENOUS
  Filled 2019-10-28: qty 500

## 2019-10-28 MED ORDER — POTASSIUM CHLORIDE 20 MEQ/15ML (10%) PO SOLN
40.0000 meq | Freq: Once | ORAL | Status: AC
  Administered 2019-10-28: 19:00:00 40 meq
  Filled 2019-10-28: qty 30

## 2019-10-28 NOTE — Progress Notes (Signed)
Recruitment maneuver done on patient by RT per CDS order Q4. Patient tolerated well. RT will obtain ABG in 30 minutes per order. RT will continue to monitor.

## 2019-10-28 NOTE — Progress Notes (Signed)
Recruitment maneuver done on patient by RT per CDS order Q4. Patient tolerated fairly well. RT will obtain ABG in 30 minutes per order. RT will continue to monitor.

## 2019-10-28 NOTE — Progress Notes (Signed)
RT completed O2 challenge per CDS. PO2 result of 254 reported to CDS

## 2019-10-28 NOTE — Procedures (Signed)
Arterial Catheter Insertion Procedure Note Debra Huff [id number] 09/10/1970  Procedure: Insertion of Arterial Catheter  Indications: Blood pressure monitoring and Frequent blood sampling  Procedure Details Consent: Risks of procedure as well as the alternatives and risks of each were explained to the (patient/caregiver).  Consent for procedure obtained. Time Out: Verified patient identification, verified procedure, site/side was marked, verified correct patient position, special equipment/implants available, medications/allergies/relevent history reviewed, required imaging and test results available.  Performed  Maximum sterile technique was used including antiseptics, cap, gloves, gown, hand hygiene, mask and sheet. Skin prep: Chlorhexidine; local anesthetic administered 20 gauge catheter was inserted into left femoral artery using the Seldinger technique. ULTRASOUND GUIDANCE USED: YES Evaluation Blood flow good; BP tracing good. Complications: No apparent complications.   Debra Huff 10/28/2019

## 2019-10-28 NOTE — Progress Notes (Signed)
RT completed O2 challenge per CDS. PO2 result of 254 reported to CDS. RT will continue to monitor.

## 2019-10-28 NOTE — Procedures (Signed)
Interventional Radiology Procedure Note  Procedure: Bedside medical liver bx, US guided.  Organ donation  Complications: None Recommendations:  - follow up pathology  Signed,  Yvone Neu. Loreta Ave, DO

## 2019-10-28 NOTE — Progress Notes (Signed)
Recruitment maneuver done on patient by RT per CDS order Q4. Patient tolerated well. RT will obtain ABG in 30 minutes per order. RT will continue to monitor.  

## 2019-10-28 NOTE — Progress Notes (Signed)
Recruitment maneuver done on patient for approximately 30 seconds at request of Weston Brass with CDS.  Patient tolerated fairly well, minute ventilation decreased to about 3.5 and blood pressure increased.  O2 Challenge was also performed.  Will continue to monitor.

## 2019-10-29 ENCOUNTER — Encounter (HOSPITAL_COMMUNITY): Payer: Self-pay | Admitting: Certified Registered Nurse Anesthetist

## 2019-10-29 ENCOUNTER — Other Ambulatory Visit (HOSPITAL_COMMUNITY): Payer: Self-pay

## 2019-10-29 ENCOUNTER — Other Ambulatory Visit: Payer: Self-pay | Admitting: Physician Assistant

## 2019-10-29 ENCOUNTER — Ambulatory Visit (HOSPITAL_COMMUNITY): Payer: Self-pay | Admitting: Certified Registered Nurse Anesthetist

## 2019-10-29 ENCOUNTER — Encounter (HOSPITAL_COMMUNITY): Admission: EM | Disposition: E | Payer: Self-pay | Source: Home / Self Care | Attending: Neurology

## 2019-10-29 HISTORY — PX: ORGAN PROCUREMENT: SHX5270

## 2019-10-29 LAB — POCT I-STAT 7, (LYTES, BLD GAS, ICA,H+H)
Acid-base deficit: 2 mmol/L (ref 0.0–2.0)
Acid-base deficit: 2 mmol/L (ref 0.0–2.0)
Acid-base deficit: 3 mmol/L — ABNORMAL HIGH (ref 0.0–2.0)
Acid-base deficit: 3 mmol/L — ABNORMAL HIGH (ref 0.0–2.0)
Acid-base deficit: 4 mmol/L — ABNORMAL HIGH (ref 0.0–2.0)
Bicarbonate: 21.7 mmol/L (ref 20.0–28.0)
Bicarbonate: 21.8 mmol/L (ref 20.0–28.0)
Bicarbonate: 22.2 mmol/L (ref 20.0–28.0)
Bicarbonate: 23 mmol/L (ref 20.0–28.0)
Bicarbonate: 23.5 mmol/L (ref 20.0–28.0)
Calcium, Ion: 1.32 mmol/L (ref 1.15–1.40)
Calcium, Ion: 1.34 mmol/L (ref 1.15–1.40)
Calcium, Ion: 1.36 mmol/L (ref 1.15–1.40)
Calcium, Ion: 1.36 mmol/L (ref 1.15–1.40)
Calcium, Ion: 1.38 mmol/L (ref 1.15–1.40)
HCT: 31 % — ABNORMAL LOW (ref 36.0–46.0)
HCT: 32 % — ABNORMAL LOW (ref 36.0–46.0)
HCT: 32 % — ABNORMAL LOW (ref 36.0–46.0)
HCT: 32 % — ABNORMAL LOW (ref 36.0–46.0)
HCT: 32 % — ABNORMAL LOW (ref 36.0–46.0)
Hemoglobin: 10.5 g/dL — ABNORMAL LOW (ref 12.0–15.0)
Hemoglobin: 10.9 g/dL — ABNORMAL LOW (ref 12.0–15.0)
Hemoglobin: 10.9 g/dL — ABNORMAL LOW (ref 12.0–15.0)
Hemoglobin: 10.9 g/dL — ABNORMAL LOW (ref 12.0–15.0)
Hemoglobin: 10.9 g/dL — ABNORMAL LOW (ref 12.0–15.0)
O2 Saturation: 100 %
O2 Saturation: 97 %
O2 Saturation: 98 %
O2 Saturation: 98 %
O2 Saturation: 98 %
Patient temperature: 97.5
Patient temperature: 97.5
Patient temperature: 97.5
Potassium: 2.8 mmol/L — ABNORMAL LOW (ref 3.5–5.1)
Potassium: 2.8 mmol/L — ABNORMAL LOW (ref 3.5–5.1)
Potassium: 2.9 mmol/L — ABNORMAL LOW (ref 3.5–5.1)
Potassium: 3.2 mmol/L — ABNORMAL LOW (ref 3.5–5.1)
Potassium: 3.4 mmol/L — ABNORMAL LOW (ref 3.5–5.1)
Sodium: 154 mmol/L — ABNORMAL HIGH (ref 135–145)
Sodium: 155 mmol/L — ABNORMAL HIGH (ref 135–145)
Sodium: 155 mmol/L — ABNORMAL HIGH (ref 135–145)
Sodium: 155 mmol/L — ABNORMAL HIGH (ref 135–145)
Sodium: 156 mmol/L — ABNORMAL HIGH (ref 135–145)
TCO2: 23 mmol/L (ref 22–32)
TCO2: 23 mmol/L (ref 22–32)
TCO2: 23 mmol/L (ref 22–32)
TCO2: 24 mmol/L (ref 22–32)
TCO2: 25 mmol/L (ref 22–32)
pCO2 arterial: 38.3 mmHg (ref 32.0–48.0)
pCO2 arterial: 38.8 mmHg (ref 32.0–48.0)
pCO2 arterial: 38.9 mmHg (ref 32.0–48.0)
pCO2 arterial: 39 mmHg (ref 32.0–48.0)
pCO2 arterial: 42.4 mmHg (ref 32.0–48.0)
pH, Arterial: 7.349 — ABNORMAL LOW (ref 7.350–7.450)
pH, Arterial: 7.353 (ref 7.350–7.450)
pH, Arterial: 7.358 (ref 7.350–7.450)
pH, Arterial: 7.369 (ref 7.350–7.450)
pH, Arterial: 7.378 (ref 7.350–7.450)
pO2, Arterial: 101 mmHg (ref 83.0–108.0)
pO2, Arterial: 315 mmHg — ABNORMAL HIGH (ref 83.0–108.0)
pO2, Arterial: 96 mmHg (ref 83.0–108.0)
pO2, Arterial: 97 mmHg (ref 83.0–108.0)
pO2, Arterial: 99 mmHg (ref 83.0–108.0)

## 2019-10-29 LAB — URINALYSIS, ROUTINE W REFLEX MICROSCOPIC
Bilirubin Urine: NEGATIVE
Glucose, UA: NEGATIVE mg/dL
Hgb urine dipstick: NEGATIVE
Ketones, ur: NEGATIVE mg/dL
Leukocytes,Ua: NEGATIVE
Nitrite: NEGATIVE
Protein, ur: 30 mg/dL — AB
Specific Gravity, Urine: 1.027 (ref 1.005–1.030)
pH: 5 (ref 5.0–8.0)

## 2019-10-29 LAB — BASIC METABOLIC PANEL
Anion gap: 12 (ref 5–15)
Anion gap: 11 (ref 5–15)
BUN: 54 mg/dL — ABNORMAL HIGH (ref 6–20)
BUN: 54 mg/dL — ABNORMAL HIGH (ref 6–20)
CO2: 19 mmol/L — ABNORMAL LOW (ref 22–32)
CO2: 20 mmol/L — ABNORMAL LOW (ref 22–32)
Calcium: 8.5 mg/dL — ABNORMAL LOW (ref 8.9–10.3)
Calcium: 8.8 mg/dL — ABNORMAL LOW (ref 8.9–10.3)
Chloride: 124 mmol/L — ABNORMAL HIGH (ref 98–111)
Chloride: 123 mmol/L — ABNORMAL HIGH (ref 98–111)
Creatinine, Ser: 0.97 mg/dL (ref 0.44–1.00)
Creatinine, Ser: 0.9 mg/dL (ref 0.44–1.00)
GFR calc Af Amer: >60 mL/min (ref >60)
GFR calc Af Amer: >60 mL/min (ref >60)
GFR calc non Af Amer: >60 mL/min (ref >60)
GFR calc non Af Amer: >60 mL/min (ref >60)
Glucose, Bld: 233 mg/dL — ABNORMAL HIGH (ref 70–99)
Glucose, Bld: 226 mg/dL — ABNORMAL HIGH (ref 70–99)
Potassium: 3.3 mmol/L — ABNORMAL LOW (ref 3.5–5.1)
Potassium: 2.9 mmol/L — ABNORMAL LOW (ref 3.5–5.1)
Sodium: 155 mmol/L — ABNORMAL HIGH (ref 135–145)
Sodium: 154 mmol/L — ABNORMAL HIGH (ref 135–145)

## 2019-10-29 LAB — CULTURE, BAL-QUANTITATIVE W GRAM STAIN
Culture: 20000 — AB
Culture: 50000 — AB

## 2019-10-29 LAB — GLUCOSE, CAPILLARY
Glucose-Capillary: 212 mg/dL — ABNORMAL HIGH (ref 70–99)
Glucose-Capillary: 214 mg/dL — ABNORMAL HIGH (ref 70–99)
Glucose-Capillary: 214 mg/dL — ABNORMAL HIGH (ref 70–99)

## 2019-10-29 LAB — SURGICAL PATHOLOGY

## 2019-10-29 SURGERY — SURGICAL PROCUREMENT, ORGAN
Anesthesia: General

## 2019-10-29 MED ORDER — 0.9 % SODIUM CHLORIDE (POUR BTL) OPTIME: TOPICAL | Status: DC | PRN

## 2019-10-29 MED ORDER — ROCURONIUM BROMIDE 10 MG/ML (PF) SYRINGE
PREFILLED_SYRINGE | INTRAVENOUS | Status: DC | PRN
Start: 2019-10-29 — End: 2019-10-29
  Administered 2019-10-29: 100 mg via INTRAVENOUS

## 2019-10-29 MED ORDER — INSULIN ASPART 100 UNIT/ML ~~LOC~~ SOLN
4.0000 [IU] | Freq: Once | SUBCUTANEOUS | Status: AC
  Administered 2019-10-29: 4 [IU] via SUBCUTANEOUS

## 2019-10-29 MED ORDER — ALBUMIN HUMAN 5 % IV SOLN: INTRAVENOUS | Status: DC | PRN

## 2019-10-29 MED ORDER — FUROSEMIDE 10 MG/ML IJ SOLN
20.0000 mg | Freq: Once | INTRAMUSCULAR | Status: AC
  Administered 2019-10-29: 02:00:00 20 mg via INTRAVENOUS
  Filled 2019-10-29: qty 2

## 2019-10-29 MED ORDER — POTASSIUM CHLORIDE 20 MEQ PO PACK
40.0000 meq | PACK | Freq: Once | ORAL | Status: AC
  Administered 2019-10-29: 02:00:00 40 meq
  Filled 2019-10-29: qty 2

## 2019-10-29 MED ORDER — VANCOMYCIN HCL IN DEXTROSE 1-5 GM/200ML-% IV SOLN
1000.0000 mg | Freq: Once | INTRAVENOUS | Status: AC
  Administered 2019-10-29: 1000 mg via INTRAVENOUS
  Filled 2019-10-29: qty 200

## 2019-10-29 MED ORDER — POTASSIUM CHLORIDE 10 MEQ/100ML IV SOLN
10.0000 meq | INTRAVENOUS | Status: AC
  Administered 2019-10-29 (×4): 10 meq via INTRAVENOUS
  Filled 2019-10-29 (×4): qty 100

## 2019-10-29 MED ORDER — FUROSEMIDE 10 MG/ML IJ SOLN
INTRAMUSCULAR | Status: DC | PRN
  Administered 2019-10-29: 80 mg via INTRAMUSCULAR

## 2019-10-29 MED ORDER — HEPARIN SODIUM (PORCINE) 1000 UNIT/ML IJ SOLN
30000.0000 [IU] | Freq: Once | INTRAMUSCULAR | Status: DC | PRN
  Filled 2019-10-29: qty 30

## 2019-10-29 MED ORDER — HEPARIN SODIUM (PORCINE) 1000 UNIT/ML IJ SOLN
INTRAMUSCULAR | Status: DC | PRN
Start: 2019-10-29 — End: 2019-10-29
  Administered 2019-10-29: 30000 [IU] via INTRAVENOUS

## 2019-10-29 MED ORDER — SODIUM CHLORIDE 0.9 % IV SOLN
1000.0000 mg | Freq: Once | INTRAVENOUS | Status: AC
  Administered 2019-10-29: 1000 mg via INTRAVENOUS
  Filled 2019-10-29: qty 8

## 2019-10-29 SURGICAL SUPPLY — 100 items
BLADE CLIPPER SURG (BLADE) IMPLANT
BLADE SAW STERNAL (BLADE) ×3 IMPLANT
BLADE STERNUM SYSTEM 6 (BLADE) ×2 IMPLANT
BLADE SURG 10 STRL SS (BLADE) IMPLANT
CLEANER INSTRU NEUTRAL RENUZYM (CLEANER/DISINFECTANT) ×2 IMPLANT
CLEANER TIP ELECTROSURG 2X2 (MISCELLANEOUS) ×2 IMPLANT
CLIP VESOCCLUDE MED 24/CT (CLIP) IMPLANT
CLIP VESOCCLUDE SM WIDE 24/CT (CLIP) IMPLANT
CNTNR URN SCR LID CUP LEK RST (MISCELLANEOUS) ×1 IMPLANT
CONT SPEC 4OZ STRL OR WHT (MISCELLANEOUS) ×3
COVER BACK TABLE 60X90IN (DRAPES) ×2 IMPLANT
COVER MAYO STAND STRL (DRAPES) ×4 IMPLANT
COVER SURGICAL LIGHT HANDLE (MISCELLANEOUS) ×7 IMPLANT
COVER WAND RF STERILE (DRAPES) ×3 IMPLANT
DRAPE HALF SHEET 40X57 (DRAPES) ×6 IMPLANT
DRAPE SLUSH MACHINE 52X66 (DRAPES) ×3 IMPLANT
DRAPE SLUSH/WARMER DISC (DRAPES) ×2 IMPLANT
DRSG COVADERM 4X10 (GAUZE/BANDAGES/DRESSINGS) IMPLANT
DRSG TEGADERM 4X4.75 (GAUZE/BANDAGES/DRESSINGS) ×2 IMPLANT
DRSG TELFA 3X8 NADH (GAUZE/BANDAGES/DRESSINGS) ×6 IMPLANT
DURAPREP 26ML APPLICATOR (WOUND CARE) IMPLANT
ELECT BLADE 6.5 EXT (BLADE) IMPLANT
ELECT REM PT RETURN 9FT ADLT (ELECTROSURGICAL) ×6
ELECTRODE REM PT RTRN 9FT ADLT (ELECTROSURGICAL) ×2 IMPLANT
GAUZE 4X4 16PLY RFD (DISPOSABLE) IMPLANT
GLOVE BIO SURGEON STRL SZ7 (GLOVE) ×2 IMPLANT
GLOVE BIO SURGEON STRL SZ7.5 (GLOVE) IMPLANT
GLOVE BIO SURGEON STRL SZ8 (GLOVE) IMPLANT
GLOVE BIO SURGEON STRL SZ8.5 (GLOVE) IMPLANT
GLOVE BIOGEL PI IND STRL 7.0 (GLOVE) IMPLANT
GLOVE BIOGEL PI IND STRL 7.5 (GLOVE) IMPLANT
GLOVE BIOGEL PI IND STRL 8 (GLOVE) IMPLANT
GLOVE BIOGEL PI IND STRL 8.5 (GLOVE) IMPLANT
GLOVE BIOGEL PI INDICATOR 7.0 (GLOVE)
GLOVE BIOGEL PI INDICATOR 7.5 (GLOVE)
GLOVE BIOGEL PI INDICATOR 8 (GLOVE)
GLOVE BIOGEL PI INDICATOR 8.5 (GLOVE)
GLOVE INDICATOR 7.5 STRL GRN (GLOVE) ×2 IMPLANT
GLOVE SURG SS PI 7.0 STRL IVOR (GLOVE) ×4 IMPLANT
GLOVE SURG SS PI 7.5 STRL IVOR (GLOVE) IMPLANT
GLOVE SURG SS PI 8.0 STRL IVOR (GLOVE) IMPLANT
GOWN STRL REUS W/ TWL LRG LVL3 (GOWN DISPOSABLE) ×4 IMPLANT
GOWN STRL REUS W/ TWL XL LVL3 (GOWN DISPOSABLE) ×2 IMPLANT
GOWN STRL REUS W/TWL LRG LVL3 (GOWN DISPOSABLE) ×15
GOWN STRL REUS W/TWL XL LVL3 (GOWN DISPOSABLE) ×6
HANDLE SUCTION POOLE (INSTRUMENTS) IMPLANT
INSERT FOGARTY 61MM (MISCELLANEOUS) ×2 IMPLANT
KIT BASIN (CUSTOM PROCEDURE TRAY) ×2 IMPLANT
KIT POST MORTEM ADULT 36X90 (BAG) ×3 IMPLANT
KIT TURNOVER KIT B (KITS) ×3 IMPLANT
LOOP VESSEL MAXI BLUE (MISCELLANEOUS) IMPLANT
LOOP VESSEL MINI RED (MISCELLANEOUS) IMPLANT
MANIFOLD NEPTUNE II (INSTRUMENTS) ×3 IMPLANT
NDL BIOPSY 14X6 SOFT TISS (NEEDLE) IMPLANT
NEEDLE BIOPSY 14X6 SOFT TISS (NEEDLE) IMPLANT
NS IRRIG 1000ML POUR BTL (IV SOLUTION) IMPLANT
PACK AORTA (CUSTOM PROCEDURE TRAY) ×3 IMPLANT
PAD ARMBOARD 7.5X6 YLW CONV (MISCELLANEOUS) ×6 IMPLANT
PAD DRESSING TELFA 3X8 NADH (GAUZE/BANDAGES/DRESSINGS) ×1 IMPLANT
PENCIL BUTTON HOLSTER BLD 10FT (ELECTRODE) ×9 IMPLANT
SOL PREP POV-IOD 4OZ 10% (MISCELLANEOUS) ×6 IMPLANT
SPONGE INTESTINAL PEANUT (DISPOSABLE) IMPLANT
SPONGE LAP 18X18 X RAY DECT (DISPOSABLE) ×8 IMPLANT
STAPLER VISISTAT 35W (STAPLE) ×5 IMPLANT
SUCTION POOLE HANDLE (INSTRUMENTS) ×3
SUT BONE WAX W31G (SUTURE) IMPLANT
SUT ETHIBOND 5 LR DA (SUTURE) ×2 IMPLANT
SUT ETHILON 1 LR 30 (SUTURE) ×6 IMPLANT
SUT ETHILON 2 LR (SUTURE) IMPLANT
SUT PROLENE 3 0 RB 1 (SUTURE) IMPLANT
SUT PROLENE 3 0 SH 1 (SUTURE) IMPLANT
SUT PROLENE 4 0 RB 1 (SUTURE) ×3
SUT PROLENE 4-0 RB1 .5 CRCL 36 (SUTURE) IMPLANT
SUT PROLENE 5 0 C 1 24 (SUTURE) IMPLANT
SUT PROLENE 6 0 BV (SUTURE) IMPLANT
SUT SILK 0 SH 30 (SUTURE) ×14 IMPLANT
SUT SILK 0 TIES 10X30 (SUTURE) ×2 IMPLANT
SUT SILK 1 SH (SUTURE) IMPLANT
SUT SILK 1 TIES 10X30 (SUTURE) ×2 IMPLANT
SUT SILK 2 0 (SUTURE)
SUT SILK 2 0 SH (SUTURE) IMPLANT
SUT SILK 2 0 SH CR/8 (SUTURE) ×2 IMPLANT
SUT SILK 2 0 TIES 10X30 (SUTURE) ×2 IMPLANT
SUT SILK 2-0 18XBRD TIE 12 (SUTURE) IMPLANT
SUT SILK 3 0 SH CR/8 (SUTURE) ×2 IMPLANT
SUT SILK 3 0 TIES 10X30 (SUTURE) ×2 IMPLANT
SWAB COLLECTION DEVICE MRSA (MISCELLANEOUS) IMPLANT
SWAB CULTURE ESWAB REG 1ML (MISCELLANEOUS) IMPLANT
SYR 20ML ECCENTRIC (SYRINGE) ×2 IMPLANT
SYR 50ML LL SCALE MARK (SYRINGE) ×2 IMPLANT
SYR TOOMEY IRRIG 70ML (MISCELLANEOUS) ×6
SYRINGE TOOMEY DISP (SYRINGE) IMPLANT
SYRINGE TOOMEY IRRIG 70ML (MISCELLANEOUS) IMPLANT
TAPE UMBILICAL 1/8 X36 TWILL (MISCELLANEOUS) ×4 IMPLANT
TUBE CONNECTING 12'X1/4 (SUCTIONS) ×2
TUBE CONNECTING 12X1/4 (SUCTIONS) ×3 IMPLANT
TUBE CONNECTING 20'X1/4 (TUBING) ×1
TUBE CONNECTING 20X1/4 (TUBING) ×1 IMPLANT
WATER STERILE IRR 1000ML POUR (IV SOLUTION) IMPLANT
YANKAUER SUCT BULB TIP NO VENT (SUCTIONS) ×3 IMPLANT

## 2019-10-29 NOTE — OR Nursing (Signed)
HEART RETRIEVED AT 1940, LUNGS RETRIEVED AT 1959. BOTH TRANSPORTED WITH CDS.

## 2019-10-29 NOTE — Progress Notes (Signed)
Recruitment maneuver done for 30 seconds by RT per CDS order Q4. ABG to follow in 30 minutes.

## 2019-10-29 NOTE — Progress Notes (Signed)
Recruitment maneuver done for 30 seconds per CDS order Q4. ABG to follow in 30 minutes.

## 2019-10-29 NOTE — H&P (Signed)
Please see consultation note by Dr. Mikki Harbor dated 10/19/2019

## 2019-10-29 NOTE — Anesthesia Preprocedure Evaluation (Signed)
Anesthesia Evaluation  Patient identified by MRN, date of birth, ID band Patient unresponsive    Reviewed: Allergy & Precautions, Patient's Chart, lab work & pertinent test results, Unable to perform ROS - Chart review only  Airway Mallampati: Intubated       Dental  (+) Dental Advisory Given   Pulmonary    breath sounds clear to auscultation       Cardiovascular  Rhythm:Regular     Neuro/Psych Brain dead CVA    GI/Hepatic   Endo/Other  Morbid obesity  Renal/GU      Musculoskeletal   Abdominal   Peds  Hematology  (+) Blood dyscrasia, anemia ,   Anesthesia Other Findings   Reproductive/Obstetrics                             Anesthesia Physical Anesthesia Plan  ASA: VI  Anesthesia Plan: General   Post-op Pain Management:    Induction: Intravenous and Inhalational  PONV Risk Score and Plan: 3 and Treatment may vary due to age or medical condition  Airway Management Planned: Oral ETT  Additional Equipment: Arterial line  Intra-op Plan:   Post-operative Plan:   Informed Consent:   Plan Discussed with: Surgeon  Anesthesia Plan Comments: (Organ procurment)        Anesthesia Quick Evaluation

## 2019-10-29 NOTE — Progress Notes (Signed)
Recruitment maneuver done for 30 seconds on patient by RT per CDS order Q4. Patient tolerated fairly well. RT will obtain ABG in 30 minutes per order. RT will continue to monitor

## 2019-10-29 NOTE — Progress Notes (Signed)
RT completed 02 challenge per CDS.  Pa02 results of 315 reported to CDS.

## 2019-10-29 NOTE — Progress Notes (Signed)
Recruitment maneuver done for 30 seconds on patient by RT per CDS order Q4. Patient tolerated fairly well. RT will obtain ABG in 30 minutes per order. RT will continue to monitor 

## 2019-10-29 NOTE — OR Nursing (Signed)
Cross clamped at 1815, Liver out at 1840, kidneys out at [employer]. Liver transported by liver team and kidneys transported by CDS.

## 2019-10-30 LAB — BPAM RBC
Blood Product Expiration Date: 202105282359
Blood Product Expiration Date: 202105292359
Blood Product Expiration Date: 202105292359
Blood Product Expiration Date: 202105292359
Unit Type and Rh: 5100
Unit Type and Rh: 5100
Unit Type and Rh: 5100
Unit Type and Rh: 5100

## 2019-10-30 LAB — TYPE AND SCREEN
ABO/RH(D): O POS
Antibody Screen: NEGATIVE
Unit division: 0
Unit division: 0
Unit division: 0
Unit division: 0

## 2019-10-30 LAB — POCT I-STAT 7, (LYTES, BLD GAS, ICA,H+H)
Acid-base deficit: 7 mmol/L — ABNORMAL HIGH (ref 0.0–2.0)
Bicarbonate: 21.2 mmol/L (ref 20.0–28.0)
Calcium, Ion: 1.31 mmol/L (ref 1.15–1.40)
HCT: 32 % — ABNORMAL LOW (ref 36.0–46.0)
Hemoglobin: 10.9 g/dL — ABNORMAL LOW (ref 12.0–15.0)
O2 Saturation: 100 %
Potassium: 3.6 mmol/L (ref 3.5–5.1)
Sodium: 156 mmol/L — ABNORMAL HIGH (ref 135–145)
TCO2: 23 mmol/L (ref 22–32)
pCO2 arterial: 53.7 mmHg — ABNORMAL HIGH (ref 32.0–48.0)
pH, Arterial: 7.204 — ABNORMAL LOW (ref 7.350–7.450)
pO2, Arterial: 305 mmHg — ABNORMAL HIGH (ref 83.0–108.0)

## 2019-10-31 NOTE — Progress Notes (Signed)
STROKE TEAM PROGRESS NOTE   INTERVAL HISTORY Patient remains critically ill with poor neurological exam which is out of proportion to the degree of sedation and likely represents significant brain damage from increased intracranial pressure and herniation.  She remains on ventilatory support for respiratory failure.  Blood pressure adequately controlled.  Family is leaning towards comfort care measures only and withdrawal of care later today.  Discussed with critical care team.  I called in yesterday and left message on ex-husband's voicemail.  Vitals:   2019/11/20 0821 11/20/2019 0830 11/20/19 0900 20-Nov-2019 0930  BP: (!) 175/77 (!) 166/80 (!) 159/75 (!) 161/74  Pulse: (!) 109 (!) 110 (!) 109 (!) 103  Resp: 19 19 17  (!) 21  Temp:      TempSrc:      SpO2: 100% 100% 100% 100%  Weight:      Height:        CBC:  Recent Labs  Lab 10/26/19 0244 11/20/2019 0348  WBC 10.6* 10.4  HGB 12.8 14.2  HCT 42.8 46.3*  MCV 101.9* 100.9*  PLT 165 176    Basic Metabolic Panel:  Recent Labs  Lab 10/26/19 0244 10/26/19 0853 2019/11/20 0348 20-Nov-2019 0811  NA 156*   < > 153* 154*  K 3.6  --  3.7  --   CL 127*  --  119*  --   CO2 24  --  26  --   GLUCOSE 144*  --  131*  --   BUN 41*  --  37*  --   CREATININE 0.78  --  0.71  --   CALCIUM 8.6*  --  8.8*  --   MG 2.4  --  2.4  --   PHOS 3.2  --  3.0  --    < > = values in this interval not displayed.   Lipid Panel:     Component Value Date/Time   CHOL 152 10/20/2019 0245   TRIG 108 10/23/2019 0604   HDL 46 10/20/2019 0245   CHOLHDL 3.3 10/20/2019 0245   VLDL 22 10/20/2019 0245   LDLCALC 84 10/20/2019 0245   HgbA1c:  Lab Results  Component Value Date   HGBA1C 5.7 (H) 10/20/2019   Urine Drug Screen:     Component Value Date/Time   LABOPIA NONE DETECTED 10/19/2019 0600   COCAINSCRNUR NONE DETECTED 10/19/2019 0600   LABBENZ NONE DETECTED 10/19/2019 0600   AMPHETMU POSITIVE (A) 10/19/2019 0600   THCU NONE DETECTED 10/19/2019 0600    LABBARB NONE DETECTED 10/19/2019 0600    Alcohol Level     Component Value Date/Time   ETH <10 10/19/2019 0057    IMAGING past 24 hours DG CHEST PORT 1 VIEW  Result Date: Nov 20, 2019 CLINICAL DATA:  Brain hemorrhage. Endotracheal tube placement. EXAM: PORTABLE CHEST 1 VIEW COMPARISON:  October 24, 2019. FINDINGS: Stable cardiomediastinal silhouette. Endotracheal and nasogastric tubes are in grossly good position. No pneumothorax or pleural effusion is noted. Lungs are clear. Bony thorax is unremarkable. IMPRESSION: Stable support apparatus. No acute cardiopulmonary abnormality seen. Electronically Signed   By: October 26, 2019 M.D.   On: 20-Nov-2019 08:42     PHYSICAL EXAM  Obese middle-aged lady who is intubated and not in distress. . Afebrile. Head is nontraumatic. Neck is supple without bruit.    Cardiac exam no murmur or gallop. Lungs are clear to auscultation. Distal pulses are well felt. Neurological Exam - intubated not on sedation., eyes closed, not following commands.  Globally aphasic with forced eye  opening, eyes in mid position, not blinking to visual threat, doll's eyes sluggish, not tracking, PERRL. Corneal reflex weak on the left, absent on the right, gag and cough present. Breathing  over the vent.  Facial symmetry not able to test due to ET tube.  Tongue protrusion outside of mouth with edema. On pain stimulation, withdraw at least 3+/5 LUE and 3/5LLE, but 2/5 RLE but 0/5 RUE. DTR 1+ and bilateral positive babinski. Sensation, coordination and gait not tested.   ASSESSMENT/PLAN Debra Huff is a 49 y.o. female with uncontrolled HTN presenting with R sided weakness, R facial droop, diaphoreses, leaning to the R and unresponsive. Found to be twitching all over w/ pinpoint pupils and SBP > 260.   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 R lateral ventricle entrapment w/ dilatation of atrium and temporal horn.   Repeat CT head 4/19 mild extension large L basal ganglia ICH. Stable IVH. Stable ventriculomegaly. 59mm midline shift. No new abnormality.  CT head 4/20 slightly smaller L basal ganglia and corona radiata ICH. Stable IVH. Stable 37mm midline shift. Stable dilation R temporal horn.    CTA head & neck No intracranial atherosclerosis. Atherosclerosis L ICA bifurcation and B cavernous ICAs.  CT head 4/21 unchanged hematoma and midline shift.  CT head 4/24 - No significant interval change in large left basal ganglia intraparenchymal hemorrhage with intraventricular extension. Associated ventriculomegaly with probable transependymal flow of CSF relatively unchanged as well. Stable 7 mm left-to-right shift.   2D Echo EF 60-65%  LDL 84  HgbA1c 5.7  SCDs for VTE prophylaxis  No antithrombotic prior to admission, now on No antithrombotic given hemorrhage   Therapy recommendations:  reconsult when stable for evaluation  Disposition:  Pending  ICH score = 3   Long-term neuro prognosis poor. Dr. Leonie Man spoke with husband. Plans for extubation today followed by comfort care  Acute Respiratory Failure d/t ICH  Intubated in ED  CCM on board   On vent  Off sedation  Likely will need trach if ongoing aggressive care  For 1-way extubation today followed by comfort care   Fever, Leukocytosis   Tmax 100.8  WBC 10.4  UA w/ bacteria, WBC 21-50  Check UCx - >100,000 col E-coli sensitive to Ceftriaxone  CXR stable 4/23  Tylenol and ice packs  Resp Cx - ABUNDANT STAPHYLOCOCCUS AUREUS -> ceftriaxone changed to Ancef today by CCM  Blood Cx 4/24 - Methicillin (oxacillin) susceptible coagulase negative staphylococcus.  Rocephin 4/23>> Per pharmacist - no change in antibiotic therapy indicated with blood culture results.  CXR 4/24 - Support apparatus as above. No other acute interval  changes.  Cerebral Edema  NSG consulted [facility]) no role for surgical intervention on admission. Following for possible EVD.  CT head 4/19 6->3mm midline shift   CT head 4/20 stable 21mm midline shift  CT head 4/21 stable 7 mm midline shift  CT head 4/24 - No significant interval change in large left basal ganglia intraparenchymal hemorrhage with intraventricular extension. Associated ventriculomegaly with probable transependymal flow of CSF relatively unchanged as well. Stable 7 mm left-to-right shift.   On 3% @ 50/h -> NS @ 50 -> off   23.4% saline x 1  Goal Na 150-155  154  Hypertensive Emergency  Home meds:  None  SBP > 260 on  arrival . SBP goal < 160 (Sunday SBP 130's to 140's after increasing Labetalol dose yesterday)  On IV Cleviprex  Off labetalol gtt  Norvasc 10 (max dose)   Labetalol 200 tid->300 tid (Max dose 2400 mg per day) (pt not bradycardic) Will increase to 400 mg tid  Losartan 50 bid (max dose)  Hydralazine 50 tid -> 100 tid (max dose) . Long-term BP goal normotensive  Hyperlipidemia  LDL 84, goal LDL < 70   on no statin PTA  Add statin on discharge  Dysphagia . Secondary to stroke . NPO . On tube feedings @ 40cc w/ prostat . Speech on board   Other Stroke Risk Factors  UDS:  Amphetamines POSITIVE - cessation education will be provided  Obesity, Body mass index is 37.05 kg/m., recommend weight loss, diet and exercise as appropriate   Other Active Problems  Polycythemia Hb 14.2 resolved  Hypokalemia 3.7 - supplemented - resolved  Hypophosphatemia 2.2->1.9->2.5->2.9->3.7->3.5- supplemented - resolved  Urinary retention, foley placed  Constipation. Bowel regimen added.  Hospital day # 8 Patient's neurological exam remains extremely poor and she remains on life support and critically ill and is unlikely to survive and have meaningful quality of life.  Family is leaning towards comfort care and likely make that decision later  today.  Discussed with critical care team and answered questions  This patient is critically ill and at significant risk of neurological worsening, death and care requires constant monitoring of vital signs, hemodynamics,respiratory and cardiac monitoring,review of multiple databases, neurological assessment, discussion with family, other specialists and medical decision making of high complexity.I  I spent 30 minutes of neurocritical care time in the care of this patient.  Delia Heady, MD    To contact Stroke Continuity provider, please refer to [email or web address]. After hours, contact General Neurology

## 2019-10-31 NOTE — Procedures (Signed)
Arterial Catheter Insertion Procedure Note Debra Huff [id number] [birth year]  Procedure: Insertion of Arterial Catheter  Indications: Blood pressure monitoring and Frequent blood sampling  Procedure Details Time Out: Verified patient identification, verified procedure, site/side was marked, verified correct patient position, special equipment/implants available, medications/allergies/relevent history reviewed, required imaging and test results available.  Performed  Maximum sterile technique was used including antiseptics, cap, gloves, gown, hand hygiene, mask and sheet. Skin prep: Chlorhexidine; local anesthetic administered 20 gauge catheter was inserted into left radial artery using the Seldinger technique. ULTRASOUND GUIDANCE USED: YES Evaluation Blood flow good; BP tracing good. Complications: No apparent complications.  Jovita Kussmaul, AGACNP-BC [facility]  Pgr: [phone]  PCCM Pgr: [phone]

## 2019-10-31 NOTE — Progress Notes (Signed)
NAME:  Debra Huff, MRN:  [id number], DOB:  1970-10-10, LOS: 8 ADMISSION DATE:  10/22/2019, CONSULTATION DATE:  2019-11-13 REFERRING MD:  Cheral Marker, CHIEF COMPLAINT:  Hemorrhagic CVA   Brief History    Debra Huff is a 49 y.o. F with HTN for which she has not sought medical care or been taking medications for years per her sister.  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 Seen by neurology and neurosurgery and deemed not a surgical candidate.  Intubated and PCCM consulted for vent management.   Extremely hypertensive 269/136 and started on Cleviprex gtt.  Past Medical History  HTN  Significant Hospital Events   4/18 Admit to ICU, neurology primary 4/23 fever 102.20F but wbc down. UA dwith pyuria and bacteriuria 4/24 still febrile. Urine culture with GNR. CT head without change - >  43mm Left to right shift and Large Left basal ganglian Hge with IV extension. Ventriculomegaly +   Consults:  Neurosurgery  PCCM  Procedures:  4/19 ETT 4/19 R IJ CVC  Significant Diagnostic Tests:  10/19/19 - U tox - amphetamines + 10/24/19 CT head>> Large intraparenchymal hematoma centered in the left basal ganglia with subarachnoid extension and 6 mm rightward midlinesShift. Right lateral ventricle entrapment with dilatation of the atrium and temporal horn CTA head 4/19 > stable basal ganglia and corona radiata hemorrhage.  Stable IVH, stable 14mm MLS.  Stable dilatation of right temporal tip CT head 4/24 -   58mm Left to right shift and Large Left basal ganglian Hge with IV extension. Ventriculomegaly +   Micro Data:  4/26 > Sars-Cov-2>>negative  4/19 >  MRS PCR neg 4/23 >  blood =4/23 - cog neg staph x 1 4/23 > urine - 100K  E colii 4/23 > trach -  STAPHYLOCOCCUS AUREUS - MSSA  Antimicrobials:  4/23 ceftriaxone > 4/25 4/25 cefazolin  >>  Interim history/subjective:  Family contact 4/26 with plans for one-way extubation today   Objective   Blood pressure (!) 161/74, pulse (!) 103, temperature (!) 100.5 F (38.1 C), temperature source Axillary, resp. rate (!) 21, height 5\' 5"  (1.651 m), weight 101 kg, SpO2 100 %.    Vent Mode: PRVC FiO2 (%):  [40 %] 40 % Set Rate:  [18 bmp] 18 bmp Vt Set:  [450 mL] 450 mL PEEP:  [5 cmH20] 5 cmH20 Pressure Support:  [10 cmH20] 10 cmH20 Plateau Pressure:  [18 cmH20-20 cmH20] 18 cmH20   Intake/Output Summary (Last 24 hours) at 2019/11/13 1000 Last data filed at 11/13/2019 0900 Gross per 24 hour  Intake 1499.79 ml  Output 2200 ml  Net -700.21 ml   Filed Weights   10/25/19 0500 10/26/19 0500 11-13-2019 0500  Weight: 102 kg 100.7 kg 101 kg      Physical Exam: General Appearance:  Adult female, on vent  HEENT: ETT/OG in place  Lungs: Clear breath sounds, no crackles/wheeze  Heart:  RRR, no MRG Abdomen:  Obese, active bowel sounds, soft  Genitalia / Rectal:  Intact  Extremities:  +2 edema to BUE  Skin:  Weeping laceration noted to right upper forearm  Neurologic: pupils intact, +cough/gag, does not follow commands, no movements noted to physical/verbal stimulation   Assessment & Plan:   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   -Evaluated by neurosurgery  and deemed to not be a surgical candidate. Plan -Per Neurology  -Continue Frequent Neuro Checks  -Now off 3%. Goal NA 150-155 (Currently 153)   Respiratory failure/insufficeny in setting of above Plan -Continue Vent Support  >> Currently undergoing SBT however Neuro Status Barrier for Extubation >> Plans for one-way extubation when family arrives  -VAP Bundle  -Trend CXR/ABG  Hypertensive Emergency  Plan -Remains off Cardene/Celeviprex. Overnight with improvement of hypotension -Continues with Losartan, Hydralazine, and Norvasc with BP goal normotensive    VAP  MSSA and E colii UTI Plan -Trend WBC and Fever Curve  -Continue ancef for 7 days    Remaining per primary team.  Best practice:  Diet: NPO. Pain/Anxiety/Delirium protocol (if indicated): propofol gtt as needed (currently off). VAP protocol (if indicated): HOB at 30 degrees. DVT prophylaxis: No DVT prophylaxis indicated given ICH GI prophylaxis: Protonix. Glucose control: SSI if glucose consistently > 180. Mobility: NA. Code Status: Plans for one-way extubation today  Family Communication: family updated at bedside    Jovita Kussmaul, [facility] [place]  Pgr: [phone]  PCCM Pgr: [phone]

## 2019-10-31 NOTE — Procedures (Signed)
Bronchoscopy Procedure Note LORIENE TAUNTON [id number] [birth year]  Procedure: Bronchoscopy Indications: Diagnostic evaluation of the airways  Procedure Details Consent: Risks of procedure as well as the alternatives and risks of each were explained to the (patient/caregiver).  Consent for procedure obtained. Time Out: Verified patient identification, verified procedure, site/side was marked, verified correct patient position, special equipment/implants available, medications/allergies/relevent history reviewed, required imaging and test results available.  Performed  In preparation for procedure, patient was given 100% FiO2 and bronchoscope lubricated. Sedation: organ donor  Airway entered and the following bronchi were examined: RUL, RML, RLL, LUL, LLL and Bronchi.   Procedures performed: Brushings performed Bronchoscope removed.    Evaluation Hemodynamic Status: BP stable throughout; O2 sats: stable throughout Patient's Current Condition: stable Specimens:  Sent clear secretions. Complications: No apparent complications Patient did tolerate procedure well.   Daleen Bo Ranvir Renovato 10/26/2019

## 2019-10-31 NOTE — Significant Event (Signed)
Discussion with family. Decision to transition to comfort measures at this time.

## 2019-10-31 NOTE — Significant Event (Addendum)
  Adult Brain Death Determination Link to Official Policy  Time of Examination: 10/28/19 3:05 PM  A. No Evidence of /Cause of Reversible CNS Depression  Core temperature must be greater >36 degrees. Last temp: Temp: (!) 97 F (36.1 C) (Note: If unable to achieve normothermia after 12 hours of temperature management, may consider proceeding with Brain Death Evaluation.):    yes  1. No evidence of severe metabolic perturbations that could potentate CNS depression. Consider glucose, Na, creatinine, PaCO2, SaO2.: yes  2. No evidence of drugs, by history or measurement, that could potentiate central nervous system depression: narcotics, ethanol, benzodiazepines, barbiturates, neuromuscular blockade.:     yes  B. Absence of Cortical Function  1. GCS = 3:    yes  C. Absence of Brain Stem Reflexes and Responses  1. Pupils light-fixed    yes  2. Absent corneal reflexes:    yes  3. Absent response to upper and lower airway stimulation, such as pharyngeal and endotracheal suctioning.:    yes  4. Absent ocular response to head turning (no eye movement).    yes  D. Absence of Spontaneous Respirations  (Apnea test performed per Brain Death Policy. If not met due to hemodynamic/ventilatory instability, then perform EEG, TCD, or cerebral blow flow studies.)  1.   Absence of Spontaneous Respirations   yes  2.   PaCO2 at start of apnea test:  45  3.   PaCO2 at end of apnea test:  81  4.   CO2 rise of 20 or greater from baseline:   yes  E. Document Confirmatory Test Utilized: (Optional) Nuclear cerebral flow, cerebral angiography (CT/MR angio), transcranial Doppler ultrasound, EEG, SSEP (record results).  1. Test results (if available):  Not applicable  Printed Name: Kipp Brood, MD 10/28/2019 3:05 PM    Declaration of death by neurological criteria:  Diagnosis: ICH   Imaging: demonstrates large ICH  Confounding factors: fentanyl - minimal dose with appropriate washout.  Clinical  examination:   Response to painful stimulation to 4 limbs: none  Response to painful central stimuli: none  Pupillary response: fixed and mid position.  Corneal response: negative  Oculocephalic response: negative  Vestibulo-occular response: absent  Gag reflex: negative  Cough reflex: negative   Apnea testing: no respiratory effort at 8 min.   Blood gas results:  ABG post apnea test.    Component Value Date/Time   PHART 7.158 (LL) 06-Nov-2019 1625   PCO2ART 85.9 (HH) 11/06/19 1625   PO2ART 153 (H) 06-Nov-2019 1625   HCO3 29.9 (H) 11/06/19 1625   TCO2 32 11-06-2019 1625   ACIDBASEDEF 1.0 2019-11-06 1625   O2SAT 98.0 2019/11/06 1625     Confirmatory testing: not indicated.   The patient was declared dead by neurological criteria at 1  [place] donor [facility] was notified.  Kipp Brood, MD [facility] Phys[facility] [place]  Pager: [phone] Mobile: [phone] After hours: [phone].  11/06/19, 4:40 PM

## 2019-10-31 NOTE — Progress Notes (Signed)
Recruitment maneuver done on patient for approximately 30 seconds at request of Weston Brass with CDS (organ donation).  Patient tolerated fairly well, minute ventilation decreased to around 3.0 and blood pressure increased.  Also did O2 Challenge as well.  Will continue to monitor.

## 2019-10-31 NOTE — Progress Notes (Signed)
Patient transported to cath lab/CT and back to 4N24 without complications.

## 2019-10-31 NOTE — Consult Note (Signed)
CARDIOLOGY CONSULT NOTE  Patient ID: Debra Huff MRN: [id number] DOB/AGE: [birth year] 49 y.o.  Admit date: 10/22/19 Referring Physician  Lynnell Catalan, MD Primary Physician:  Patient, No Pcp Per Reason for Consultation  Need for cardiac evaluation for organ harvest  Patient ID: Debra Huff, female    DOB: Aug 11, 1970, 49 y.o.   MRN: [id number] CC: Altered mental status  HPI:    Debra Huff  is a 49 y.o. Female patient admitted via EMS on October 22, 2019 with altered mental status and upon presentation to the emergency room was found to have large intraparenchymal hemorrhage with subarachnoid extension on her CT scan of the brain.  Patient was intubated, neurology and also neurosurgery was consulted, metabolic abnormality was corrected but as patient continued to be unresponsive, today at 4:35 PM, patient was declared dead by neurologic criteria and I was requested to see the patient for urgent cardiac catheterization in view of potential organ harvest and organ donation.  History is obtained by chart review.  History reviewed. No pertinent past medical history. History reviewed. No pertinent surgical history. Social History   Socioeconomic History  . Marital status: Married    Spouse name: Not on file  . Number of children: Not on file  . Years of education: Not on file  . Highest education level: Not on file  Occupational History  . Not on file  Tobacco Use  . Smoking status: Not on file  Substance and Sexual Activity  . Alcohol use: Not on file  . Drug use: Not on file  . Sexual activity: Not on file  Other Topics Concern  . Not on file  Social History Narrative  . Not on file   Social Determinants of Health   Financial Resource Strain:   . Difficulty of Paying Living Expenses:   Food Insecurity:   . Worried About Programme researcher, broadcasting/film/video in the Last Year:   . Barista in the Last Year:   Transportation Needs:   . Freight forwarder (Medical):   Marland Kitchen Lack of  Transportation (Non-Medical):   Physical Activity:   . Days of Exercise per Week:   . Minutes of Exercise per Session:   Stress:   . Feeling of Stress :   Social Connections:   . Frequency of Communication with Friends and Family:   . Frequency of Social Gatherings with Friends and Family:   . Attends Religious Services:   . Active Member of Clubs or Organizations:   . Attends Banker Meetings:   Marland Kitchen Marital Status:   Intimate Partner Violence:   . Fear of Current or Ex-Partner:   . Emotionally Abused:   Marland Kitchen Physically Abused:   . Sexually Abused:    ROS  Review of Systems  Unable to perform ROS: mental status change   Objective   Vitals with BMI 10/30/2019 10/12/2019 10/23/2019  Height - - -  Weight - - -  BMI - - -  Systolic - 91 82  Diastolic - 53 39  Pulse 109 108 102    Blood pressure (!) 91/53, pulse (!) 109, temperature (!) 100.5 F (38.1 C), temperature source Axillary, resp. rate 18, height 5\' 5"  (1.651 m), weight 101 kg, SpO2 100 %.    Physical Exam  Constitutional: She is intubated.  Cardiovascular: Normal rate, regular rhythm, normal heart sounds, intact distal pulses and normal pulses. Exam reveals no gallop.  No murmur heard. No leg edema, no JVD.  Pulmonary/Chest: Effort  normal and breath sounds normal. She is intubated.  Abdominal: Soft. Bowel sounds are normal.   Laboratory examination:   Recent Labs    10/25/19 0343 10/25/19 0820 10/26/19 0244 10/26/19 0853 10/24/2019 0348 10/04/2019 0348 10/14/2019 0811 10/21/2019 1609 10/28/2019 1625  NA 158*   < > 156*   < > 153*   < > 154* 155* 156*  K 3.6  --  3.6  --  3.7  --   --  3.6 3.5  CL 125*  --  127*  --  119*  --   --   --   --   CO2 25  --  24  --  26  --   --   --   --   GLUCOSE 123*  --  144*  --  131*  --   --   --   --   BUN 39*  --  41*  --  37*  --   --   --   --   CREATININE 0.79  --  0.78  --  0.71  --   --   --   --   CALCIUM 8.7*  --  8.6*  --  8.8*  --   --   --   --   GFRNONAA  >60  --  >60  --  >60  --   --   --   --   GFRAA >60  --  >60  --  >60  --   --   --   --    < > = values in this interval not displayed.   estimated creatinine clearance is 100.2 mL/min (by C-G formula based on SCr of 0.71 mg/dL).  CMP Latest Ref Rng & Units 10/10/2019 10/28/2019 10/15/2019  Glucose 70 - 99 mg/dL - - -  BUN 6 - 20 mg/dL - - -  Creatinine 9.56 - 1.00 mg/dL - - -  Sodium 387 - 564 mmol/L 156(H) 155(H) 154(H)  Potassium 3.5 - 5.1 mmol/L 3.5 3.6 -  Chloride 98 - 111 mmol/L - - -  CO2 22 - 32 mmol/L - - -  Calcium 8.9 - 10.3 mg/dL - - -  Total Protein 6.5 - 8.1 g/dL - - -  Total Bilirubin 0.3 - 1.2 mg/dL - - -  Alkaline Phos 38 - 126 U/L - - -  AST 15 - 41 U/L - - -  ALT 0 - 44 U/L - - -   CBC Latest Ref Rng & Units 10/01/2019 10/09/2019 10/06/2019  WBC 4.0 - 10.5 K/uL - - 10.4  Hemoglobin 12.0 - 15.0 g/dL 33.2 95.1 88.4  Hematocrit 36.0 - 46.0 % 44.0 41.0 46.3(H)  Platelets 150 - 400 K/uL - - 176   Lipid Panel     Component Value Date/Time   CHOL 152 10/20/2019 0245   TRIG 108 10/23/2019 0604   HDL 46 10/20/2019 0245   CHOLHDL 3.3 10/20/2019 0245   VLDL 22 10/20/2019 0245   LDLCALC 84 10/20/2019 0245   HEMOGLOBIN A1C Lab Results  Component Value Date   HGBA1C 5.7 (H) 10/20/2019   MPG 116.89 10/20/2019   TSH Recent Labs    10/20/19 0245  TSH 0.833   BNP (last 3 results) No results for input(s): BNP in the last 8760 hours.  Medications and allergies  No Known Allergies   Prior to Admission medications   Not on File    . sodium chloride Stopped (10/26/19 1700)  .  ceFAZolin (ANCEF) IV    . lactated ringers    . levothyroxine (SYNTHROID) IV Infusion    . methylPREDNISolone (SOLU-MEDROL) injection    . methylPREDNISolone (SOLU-MEDROL) injection    . norepinephrine (LEVOPHED) Adult infusion 40 mcg/min (10/09/2019 1630)  . vasopressin (PITRESSIN) infusion - *FOR SHOCK* 0.03 Units/min (10/04/2019 1811)    No current outpatient medications  I/O last 3  completed shifts: In: 2393.2 [I.V.:173.3; Other:40; NG/GT:1680; IV Piggyback:499.9] Out: 2725 [Urine:2525; Stool:200] Total I/O In: 181 [I.V.:61; NG/GT:120] Out: 500 [Urine:500]    Radiology:   Imaging: DG CHEST PORT 1 VIEW  Result Date: 10/23/2019 CLINICAL DATA:  Brain hemorrhage. Endotracheal tube placement. EXAM: PORTABLE CHEST 1 VIEW COMPARISON:  October 24, 2019. FINDINGS: Stable cardiomediastinal silhouette. Endotracheal and nasogastric tubes are in grossly good position. No pneumothorax or pleural effusion is noted. Lungs are clear. Bony thorax is unremarkable. IMPRESSION: Stable support apparatus. No acute cardiopulmonary abnormality seen. Electronically Signed   By: Marijo Conception M.D.   On: 10/12/2019 08:42    Cardiac Studies:   Echocardiogram 10/19/2019:  1. Left ventricular ejection fraction, by estimation, is 60 to 65%. The  left ventricle has normal function. The left ventricle has no regional  wall motion abnormalities. Left ventricular diastolic parameters are  consistent with Grade I diastolic  dysfunction (impaired relaxation). Elevated left atrial pressure.  2. Right ventricular systolic function is normal. The right ventricular  size is normal.  3. No significant valvular abnormality  EKG:  EKG Oct 23, 2019: Normal sinus rhythm with rate of 82 bpm, left atrial enlargement, borderline criteria for LVH.  Early repolarization.  Prolonged QTc interval at 503 ms.  Assessment   1.  Patient declared dead by neurologic criteria now for organ harvest..  Organ harvest cardiac catheterization being performed. 2.  Intracerebral hemorrhage secondary to hypertension.  Recommendations:   Per the request of the physician's in the patient's care team, will proceed with urgent cardiac catheterization. Patient's husband has consented to organ donation and also procedures needed for preparation. [place] [facility] present. I have personally discussed with Dr. Lynetta Mare. Proceed with  coronary angiogram.    In spite of need for pressors, has normal vascular exam and normal heart sounds.  Adrian Prows, MD, [facility] 10/01/2019, 6:22 PM [facility] [place] Pager: [phone] Office: [phone]

## 2019-10-31 NOTE — Progress Notes (Signed)
Nutrition Brief Note  Chart reviewed. Pt now transitioning to comfort care.  No further nutrition interventions planned at this time.  Please re-consult as needed.   Rustin Erhart P., RD, LDN, CNSC See AMiON for contact information    

## 2019-10-31 NOTE — Progress Notes (Signed)
Patient ID: Debra Huff, female   DOB: [birth year], 49 y.o.   MRN: [id number] No significant change neurologically.  Patient was being repositioned and cleaned when I came by rounds.    CT scan stable no change in ventriculomegaly clot seems to have resolved in the 4th ventricle small amount of clot remains in the 3rd ventricle and aqueduct   primary service continues to try to address goals of care with family and team will continue to follow for possible EVD placement if primary service feels it would be beneficial and family wants to pursue continued aggressive care.

## 2019-10-31 NOTE — Procedures (Signed)
Central Venous Catheter Insertion Procedure Note Debra Huff [id number] 10-27-1970  Procedure: Insertion of Central Venous Catheter Indications: Drug and/or fluid administration  Procedure Details Consent: Risks of procedure as well as the alternatives and risks of each were explained to the (patient/caregiver).  Consent for procedure obtained. Time Out: Verified patient identification, verified procedure, site/side was marked, verified correct patient position, special equipment/implants available, medications/allergies/relevent history reviewed, required imaging and test results available.  Performed  Maximum sterile technique was used including antiseptics, cap, gloves, gown, hand hygiene, mask and sheet. Skin prep: Chlorhexidine; local anesthetic administered A antimicrobial bonded/coated triple lumen catheter was placed in the left internal jugular vein using the Seldinger technique. Ultrasound guidance.      Evaluation Blood flow good Complications: No apparent complications Patient did tolerate procedure well. Chest X-ray ordered to verify placement.  CXR: pending. Position confirmed by echo contrast  Courteny Egler 2019/11/07, 6:53 PM

## 2019-10-31 NOTE — Progress Notes (Signed)
RT completed apena test per MD order with MD, NP, RN and second RT at bedside.  Before ABG: 7.36/48.8/416/27.1  RT took patient off vent and deflated cuff while placing the oxygen tubing into ETT at 8L for 8 minutes per protocol. No respirations noted per MD, RT and NP. RT placed patient back on vent and re-inflated cuff once ABG was obtained.  After ABG: 7.15/85.9/153/29.9  RT will continue to monitor.

## 2019-10-31 DEATH — deceased

## 2019-11-01 LAB — CULTURE, BLOOD (ROUTINE X 2): Culture: NO GROWTH

## 2019-11-02 LAB — SURGICAL PATHOLOGY

## 2019-11-03 LAB — CULTURE, BLOOD (ROUTINE X 2)
Culture: NO GROWTH
Culture: NO GROWTH
Special Requests: ADEQUATE
Special Requests: ADEQUATE

## 2019-12-01 NOTE — Death Summary Note (Signed)
DEATH SUMMARY   Patient Details  Name: Debra Huff MRN: [id number] DOB: 1971/04/29  Admission/Discharge Information   Admit Date:  2019/10/25  Date of Death: Date of Death: 11/03/2019  Time of Death: Time of Death: 08/22/24  Length of Stay: 2022-08-18  Referring Physician: Patient, No Pcp Per   Reason(s) for Hospitalization  intracranial hemorrhage  Diagnoses  Preliminary cause of death: cerebral herniation Secondary Diagnoses (including complications and co-morbidities):  Active Problems:   Intracranial hemorrhage (HCC)   Intracerebral hemorrhage   Respiratory failure (HCC)   Blood pressure elevated without history of HTN   Hypophosphatemia   Hypokalemia   Cytotoxic brain edema (HCC)   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 year old female who presented with altered mental status and left facial droop.  CT revealed the basal ganglia large hemispheric intracranial hemorrhage with intraventricular extension and brainstem compression on presentation.  Deemed not to be a surgical candidate.  Started on Cleviprex for extreme hypertension.  No improvement in neurological examination.  After discussion with family was decided to withdraw care.  At that point repeat examination confirmed that she had progressed to brain death and she became a candidate for organ donation.  Care was then assumed by [place] [facility].  Bronchoscopy, liver biopsy and coronary catheterization were performed as part of organ retrieval process.  Both kidneys, liver, both lungs, heart were all harvested.  Pertinent Labs and Studies  Significant Diagnostic Studies CT ABDOMEN PELVIS WO CONTRAST  Addendum Date: 10/28/2019   ADDENDUM REPORT: 10/28/2019 18:21 ADDENDUM: Addendum created at the request of the referring clinician for measurements of the liver and both kidneys. The liver  measures 17.8 cm cranial caudal, 18.9 cm transverse, and 16.3 cm AP at the level of the main portal vein. The left kidney is atrophic and measures 7.6 cm cranial caudal, 3.2 cm transverse, and 3.6 cm AP. The right kidney measures 11.1 cm cranial caudal, 6.2 cm transverse, and 5.5 cm AP. Electronically Signed   By: Keith Rake M.D.   On: 10/28/2019 18:21   Result Date: 10/28/2019 CLINICAL DATA:  Preop organ donation. EXAM: CT CHEST, ABDOMEN AND PELVIS WITHOUT CONTRAST TECHNIQUE: Multidetector CT imaging of the chest, abdomen and pelvis was performed following the standard protocol without IV contrast. COMPARISON:  Radiograph earlier this day. No prior cross-sectional imaging. FINDINGS: CT CHEST FINDINGS Cardiovascular: Left internal jugular central line tip in the SVC. Thoracic aorta is normal in caliber. No aortic atherosclerosis. Heart is normal in size. No pericardial effusion. Mediastinum/Nodes: No mediastinal adenopathy. No evidence of hilar adenopathy allowing for lack of contrast. No suspicious thyroid nodule. Endotracheal tube tip above the carina. Enteric tube decompresses the esophagus. Lungs/Pleura: Mild upper lobe predominant emphysema. Dependent lower lobe opacities with air bronchograms, right greater than left, favoring atelectasis. Additional atelectasis in the right middle lobe. Trace bilateral pleural effusions. Minimal retained mucus in the distal trachea and mainstem bronchi. No evidence of pulmonary mass in the aerated lungs. Musculoskeletal: There are no acute or suspicious osseous abnormalities. CT ABDOMEN PELVIS FINDINGS Hepatobiliary: No focal hepatic abnormality. Gallbladder physiologically distended, no calcified stone. No biliary dilatation. Pancreas: No ductal dilatation or inflammation. Pancreatic tail is slightly truncated. Spleen: Normal in size without focal abnormality. Adrenals/Urinary Tract: Mild adrenal thickening without dominant nodule. There is excreted IV contrast in  both renal collecting systems, patient had cardiac catheterization. Moderate  left renal atrophy with mild compensatory hypertrophy of the right kidney. No evidence of focal renal mass. Surgical clips adjacent to the left renal pelvis. Urinary bladder is decompressed by Foley catheter. There is excreted IV contrast in the urinary bladder. Stomach/Bowel: Enteric tube within the stomach. Stomach mildly fluid-filled without gastric wall thickening. No small bowel obstruction. Normal appendix. Fluid-filled ascending colon. Mild air in fluid distension of the transverse colon. Fluid-filled descending colon. No colonic wall thickening or inflammation. Formed stool in the distal most colon. Vascular/Lymphatic: Mild bi-iliac atherosclerosis. Stranding adjacent to the right femoral vessels consistent with recent catheterization. No retroperitoneal hematoma. No aortic aneurysm. No bulky abdominopelvic adenopathy. Reproductive: Uterus and bilateral adnexa are unremarkable. Other: No intra-abdominal free fluid or free air. No intra-abdominal mass. Small fat containing umbilical hernia. Musculoskeletal: Degenerative disc disease at L5-S1. There are no acute or suspicious osseous abnormalities. IMPRESSION: Chest: 1. Dependent lower lobe opacities with air bronchograms, right greater than left, favoring atelectasis. Additional atelectasis in the right middle lobe. Trace bilateral pleural effusions. 2. Mild emphysema. Abdomen/pelvis: 1. Moderate left renal atrophy with mild compensatory hypertrophy of the right kidney. 2. Pancreatic tail slightly truncated. 3. Mild bi-iliac atherosclerosis. Aortic Atherosclerosis (ICD10-I70.0) and Emphysema (ICD10-J43.9). Electronically Signed: By: Keith Rake M.D. On: 11/05/19 20:28   CT ANGIO HEAD W OR WO CONTRAST  Result Date: 10/20/2019 CLINICAL DATA:  Left corona radiata and basal ganglia parenchymal hemorrhage with extension into the ventricles. EXAM: CT ANGIOGRAPHY HEAD AND NECK  TECHNIQUE: Multidetector CT imaging of the head and neck was performed using the standard protocol during bolus administration of intravenous contrast. Multiplanar CT image reconstructions and MIPs were obtained to evaluate the vascular anatomy. Carotid stenosis measurements (when applicable) are obtained utilizing NASCET criteria, using the distal internal carotid diameter as the denominator. CONTRAST:  83m OMNIPAQUE IOHEXOL 350 MG/ML SOLN COMPARISON:  CT head without contrast 10/19/19 and 10/12/2019. FINDINGS: CT HEAD FINDINGS Brain: Left basal ganglia and corona radiata hemorrhage is stable to slightly smaller, measuring 3.6 x 6.5 x 3.3 cm. Intraventricular extension and posterior layering is stable. Dilated right temporal tip is stable. 7 mm midline shift is stable. Surrounding edema is noted. Sulci are effaced. Blood is present in the fourth ventricle and aqueduct. Vascular: Atherosclerotic calcifications are present within the cavernous internal carotid arteries bilaterally. Skull: Calvarium is intact. No focal lytic or blastic lesions are present. No significant extracranial soft tissue lesion is present. Sinuses: The paranasal sinuses and mastoid air cells are clear. Orbits: The globes and orbits are within normal limits. Review of the MIP images confirms the above findings CTA NECK FINDINGS Aortic arch: A 3 vessel arch configuration is present. Atherosclerotic changes are noted at the arch without significant stenosis. Right carotid system: The right common carotid artery is within normal limits. Bifurcation is unremarkable. Cervical right ICA is normal. Left carotid system: The left common carotid artery is within normal limits. Mild atherosclerotic changes are noted at the bifurcation. Cervical left ICA is normal. Vertebral arteries: The vertebral arteries are codominant. Both vertebral arteries originate from the subclavian arteries without significant stenosis. No significant stenosis is present in either  vertebral artery in the neck. Skeleton: Vertebral body heights are maintained. No significant listhesis is present. Straightening of the normal cervical lordosis is noted. The patient is edentulous. Other neck: Patient is intubated. OG tube is in place. Right IJ line is normal. Soft tissues are otherwise unremarkable. Upper chest: A small right pleural effusion is present. Emphysematous changes are noted. Upper lung  fields are otherwise clear. Review of the MIP images confirms the above findings CTA HEAD FINDINGS Anterior circulation: Atherosclerotic changes are present within the cavernous internal carotid arteries bilaterally. No significant stenosis is present. The A1 and M1 segments are normal. The anterior communicating artery is patent. MCA bifurcations are intact. The ACA and MCA branch vessels are within normal limits. Posterior circulation: The vertebral arteries are codominant. PICA origins are visualized and normal. The vertebrobasilar junction is normal. Basilar artery is normal. Both posterior cerebral arteries originate from basilar tip. PCA branch vessels are within normal limits bilaterally. Venous sinuses: The dural sinuses are patent. The straight sinus is and deep cerebral veins are intact. Cortical veins are unremarkable. No vascular malformation is evident. Anatomic variants: None Review of the MIP images confirms the above findings IMPRESSION: 1. Stable to slightly smaller left basal ganglia and corona radiata hemorrhage. 2. Stable intraventricular extension of hemorrhage. 3. Stable 7 mm midline shift. 4. Stable dilatation of the right temporal tip. 5. Normal CTA circle-of-Willis without significant proximal stenosis, aneurysm, or branch vessel occlusion. No acute or focal lesion to explain the hemorrhage. 6. Atherosclerotic changes at the left carotid bifurcation and cavernous internal carotid arteries bilaterally without significant stenosis. Electronically Signed   By: San Morelle  M.D.   On: 10/20/2019 07:36   CT HEAD WO CONTRAST  Result Date: 10/24/2019 CLINICAL DATA:  Follow-up examination for acute intracranial hemorrhage. EXAM: CT HEAD WITHOUT CONTRAST TECHNIQUE: Contiguous axial images were obtained from the base of the skull through the vertex without intravenous contrast. COMPARISON:  Prior CT from 10/21/2019 FINDINGS: Brain: Large intraparenchymal hemorrhage centered at the left lentiform nucleus again seen, not significantly interval changed in size measuring approximately 6.8 x 3.5 x 5.0 cm. Surrounding low-density vasogenic edema with regional mass effect is similar to perhaps slightly increased. Associated left-to-right midline shift measures 7 mm, relatively stable. Intraventricular extension with blood seen throughout the lateral and third ventricles again seen. Mild ventriculomegaly with asymmetric dilatation of the right lateral ventricle is relatively stable. Probable mild transependymal flow of CSF also relatively unchanged. No other new intracranial hemorrhage. No other acute large vessel territory infarct. 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 No extra-axial fluid collection. Vascular: No hyperdense vessel. Skull: Scalp soft tissues and calvarium within normal limits. Sinuses/Orbits: Globes and orbital soft tissues demonstrate no acute finding. Scattered chronic mucosal thickening noted within the paranasal sinuses. Chronic appearing right mastoid and middle ear effusion noted, unchanged. Other: None. IMPRESSION: 1. No significant interval change in large left basal ganglia intraparenchymal hemorrhage with intraventricular extension. Associated ventriculomegaly with probable transependymal flow of CSF relatively unchanged as well. Stable 7 mm left-to-right shift. 2. No other new acute intracranial abnormality. Electronically Signed   By: Jeannine Boga M.D.   On: 10/24/2019 02:20   CT HEAD WO CONTRAST  Result Date:  10/21/2019 CLINICAL DATA:  Stroke follow-up. Intracranial hemorrhage. EXAM: CT HEAD WITHOUT CONTRAST TECHNIQUE: Contiguous axial images were obtained from the base of the skull through the vertex without intravenous contrast. COMPARISON:  10/20/2019 FINDINGS: Brain: A large parenchymal hemorrhage centered in the left basal ganglia has not significantly changed in size, measuring approximately 6.7 x 3.8 cm with unchanged mild surrounding edema. Intraventricular extension is again noted with similar appearance of moderately large volume hemorrhage in the left greater than right lateral ventricles and smaller volume hemorrhage in the third and fourth ventricles. Mild ventriculomegaly is unchanged as is confluent hypoattenuation in the periventricular white matter compatible with  transependymal CSF flow. Rightward midline shift is unchanged measuring 7 mm. No acute large territory infarct is identified separate from the hemorrhage. There is no extra-axial fluid collection. Vascular: Calcified atherosclerosis at the skull base. Skull: No fracture or suspicious osseous lesion. Sinuses/Orbits: Mild mucosal thickening in the paranasal sinuses. Persistent opacification of the right mastoid air cells and right middle ear cavity. Unremarkable orbits. Other: None. IMPRESSION: Unchanged large left basal ganglia hemorrhage with intraventricular extension, mild ventriculomegaly and subependymal CSF flow, and 7 mm of rightward midline shift. Electronically Signed   By: Logan Bores M.D.   On: 10/21/2019 16:12   CT HEAD WO CONTRAST  Result Date: 10/19/2019 CLINICAL DATA:  49 year old female status post code stroke presentation on 10/22/2019 with acute hemorrhage into the left hemisphere and ventricles at that time. EXAM: CT HEAD WITHOUT CONTRAST TECHNIQUE: Contiguous axial images were obtained from the base of the skull through the vertex without intravenous contrast. COMPARISON:  10/25/2019 head CT. FINDINGS: Brain: Large,  oval, intra-axial hyperdense hemorrhage centered at the left lentiform and corona radiata now encompasses 68 x 39 x 49 mm (AP by transverse by CC). Versus approximately 61 by 39 by 44 mm previously when measured in the same way ( about 65 mL estimated intra-axial blood volume today versus 52 mL by my calculation at presentation. Relatively large volume of intraventricular hemorrhage extension appears stable with mild to moderate ventriculomegaly. Mild transependymal edema. Stable rightward midline shift of 7 mm. No subdural or epidural blood identified. Stable basilar cisterns. Left hemisphere and transependymal edema but no superimposed acute cortically based ischemic infarct identified. Vascular: Calcified atherosclerosis at the skull base. Skull: Negative. Sinuses/Orbits: Subtotal opacification of the right tympanic cavity, with chronic sclerosis at the right mastoid antrum (series 4, image 23) and opacified other right mastoid air cells. But the left tympanic cavity, mastoids, and visible paranasal sinuses are well pneumatized. Other: No acute orbit or scalp soft tissue finding. There is fluid in the visible pharynx, and the patient appears to be intubated. IMPRESSION: 1. Mild extension of left basal ganglia intra-axial hemorrhage since yesterday, relatively large up to 6.8 cm long axis. 2. Stable relatively large volume of intraventricular extension. Stable ventriculomegaly and sub appended mole edema. 3. Stable left hemisphere edema and 7 mm of rightward midline shift. Basilar cisterns remain patent. 4. No new intracranial abnormality. Electronically Signed   By: Genevie Ann M.D.   On: 10/19/2019 06:10   CT ANGIO NECK W OR WO CONTRAST  Result Date: 10/20/2019 CLINICAL DATA:  Left corona radiata and basal ganglia parenchymal hemorrhage with extension into the ventricles. EXAM: CT ANGIOGRAPHY HEAD AND NECK TECHNIQUE: Multidetector CT imaging of the head and neck was performed using the standard protocol during  bolus administration of intravenous contrast. Multiplanar CT image reconstructions and MIPs were obtained to evaluate the vascular anatomy. Carotid stenosis measurements (when applicable) are obtained utilizing NASCET criteria, using the distal internal carotid diameter as the denominator. CONTRAST:  28m OMNIPAQUE IOHEXOL 350 MG/ML SOLN COMPARISON:  CT head without contrast 10/19/19 and 10/14/2019. FINDINGS: CT HEAD FINDINGS Brain: Left basal ganglia and corona radiata hemorrhage is stable to slightly smaller, measuring 3.6 x 6.5 x 3.3 cm. Intraventricular extension and posterior layering is stable. Dilated right temporal tip is stable. 7 mm midline shift is stable. Surrounding edema is noted. Sulci are effaced. Blood is present in the fourth ventricle and aqueduct. Vascular: Atherosclerotic calcifications are present within the cavernous internal carotid arteries bilaterally. Skull: Calvarium is intact. No focal lytic  or blastic lesions are present. No significant extracranial soft tissue lesion is present. Sinuses: The paranasal sinuses and mastoid air cells are clear. Orbits: The globes and orbits are within normal limits. Review of the MIP images confirms the above findings CTA NECK FINDINGS Aortic arch: A 3 vessel arch configuration is present. Atherosclerotic changes are noted at the arch without significant stenosis. Right carotid system: The right common carotid artery is within normal limits. Bifurcation is unremarkable. Cervical right ICA is normal. Left carotid system: The left common carotid artery is within normal limits. Mild atherosclerotic changes are noted at the bifurcation. Cervical left ICA is normal. Vertebral arteries: The vertebral arteries are codominant. Both vertebral arteries originate from the subclavian arteries without significant stenosis. No significant stenosis is present in either vertebral artery in the neck. Skeleton: Vertebral body heights are maintained. No significant listhesis is  present. Straightening of the normal cervical lordosis is noted. The patient is edentulous. Other neck: Patient is intubated. OG tube is in place. Right IJ line is normal. Soft tissues are otherwise unremarkable. Upper chest: A small right pleural effusion is present. Emphysematous changes are noted. Upper lung fields are otherwise clear. Review of the MIP images confirms the above findings CTA HEAD FINDINGS Anterior circulation: Atherosclerotic changes are present within the cavernous internal carotid arteries bilaterally. No significant stenosis is present. The A1 and M1 segments are normal. The anterior communicating artery is patent. MCA bifurcations are intact. The ACA and MCA branch vessels are within normal limits. Posterior circulation: The vertebral arteries are codominant. PICA origins are visualized and normal. The vertebrobasilar junction is normal. Basilar artery is normal. Both posterior cerebral arteries originate from basilar tip. PCA branch vessels are within normal limits bilaterally. Venous sinuses: The dural sinuses are patent. The straight sinus is and deep cerebral veins are intact. Cortical veins are unremarkable. No vascular malformation is evident. Anatomic variants: None Review of the MIP images confirms the above findings IMPRESSION: 1. Stable to slightly smaller left basal ganglia and corona radiata hemorrhage. 2. Stable intraventricular extension of hemorrhage. 3. Stable 7 mm midline shift. 4. Stable dilatation of the right temporal tip. 5. Normal CTA circle-of-Willis without significant proximal stenosis, aneurysm, or branch vessel occlusion. No acute or focal lesion to explain the hemorrhage. 6. Atherosclerotic changes at the left carotid bifurcation and cavernous internal carotid arteries bilaterally without significant stenosis. Electronically Signed   By: San Morelle M.D.   On: 10/20/2019 07:36   CT CHEST WO CONTRAST  Addendum Date: 10/28/2019   ADDENDUM REPORT:  10/28/2019 18:21 ADDENDUM: Addendum created at the request of the referring clinician for measurements of the liver and both kidneys. The liver measures 17.8 cm cranial caudal, 18.9 cm transverse, and 16.3 cm AP at the level of the main portal vein. The left kidney is atrophic and measures 7.6 cm cranial caudal, 3.2 cm transverse, and 3.6 cm AP. The right kidney measures 11.1 cm cranial caudal, 6.2 cm transverse, and 5.5 cm AP. Electronically Signed   By: Keith Rake M.D.   On: 10/28/2019 18:21   Result Date: 10/28/2019 CLINICAL DATA:  Preop organ donation. EXAM: CT CHEST, ABDOMEN AND PELVIS WITHOUT CONTRAST TECHNIQUE: Multidetector CT imaging of the chest, abdomen and pelvis was performed following the standard protocol without IV contrast. COMPARISON:  Radiograph earlier this day. No prior cross-sectional imaging. FINDINGS: CT CHEST FINDINGS Cardiovascular: Left internal jugular central line tip in the SVC. Thoracic aorta is normal in caliber. No aortic atherosclerosis. Heart is normal in size.  No pericardial effusion. Mediastinum/Nodes: No mediastinal adenopathy. No evidence of hilar adenopathy allowing for lack of contrast. No suspicious thyroid nodule. Endotracheal tube tip above the carina. Enteric tube decompresses the esophagus. Lungs/Pleura: Mild upper lobe predominant emphysema. Dependent lower lobe opacities with air bronchograms, right greater than left, favoring atelectasis. Additional atelectasis in the right middle lobe. Trace bilateral pleural effusions. Minimal retained mucus in the distal trachea and mainstem bronchi. No evidence of pulmonary mass in the aerated lungs. Musculoskeletal: There are no acute or suspicious osseous abnormalities. CT ABDOMEN PELVIS FINDINGS Hepatobiliary: No focal hepatic abnormality. Gallbladder physiologically distended, no calcified stone. No biliary dilatation. Pancreas: No ductal dilatation or inflammation. Pancreatic tail is slightly truncated. Spleen: Normal  in size without focal abnormality. Adrenals/Urinary Tract: Mild adrenal thickening without dominant nodule. There is excreted IV contrast in both renal collecting systems, patient had cardiac catheterization. Moderate left renal atrophy with mild compensatory hypertrophy of the right kidney. No evidence of focal renal mass. Surgical clips adjacent to the left renal pelvis. Urinary bladder is decompressed by Foley catheter. There is excreted IV contrast in the urinary bladder. Stomach/Bowel: Enteric tube within the stomach. Stomach mildly fluid-filled without gastric wall thickening. No small bowel obstruction. Normal appendix. Fluid-filled ascending colon. Mild air in fluid distension of the transverse colon. Fluid-filled descending colon. No colonic wall thickening or inflammation. Formed stool in the distal most colon. Vascular/Lymphatic: Mild bi-iliac atherosclerosis. Stranding adjacent to the right femoral vessels consistent with recent catheterization. No retroperitoneal hematoma. No aortic aneurysm. No bulky abdominopelvic adenopathy. Reproductive: Uterus and bilateral adnexa are unremarkable. Other: No intra-abdominal free fluid or free air. No intra-abdominal mass. Small fat containing umbilical hernia. Musculoskeletal: Degenerative disc disease at L5-S1. There are no acute or suspicious osseous abnormalities. IMPRESSION: Chest: 1. Dependent lower lobe opacities with air bronchograms, right greater than left, favoring atelectasis. Additional atelectasis in the right middle lobe. Trace bilateral pleural effusions. 2. Mild emphysema. Abdomen/pelvis: 1. Moderate left renal atrophy with mild compensatory hypertrophy of the right kidney. 2. Pancreatic tail slightly truncated. 3. Mild bi-iliac atherosclerosis. Aortic Atherosclerosis (ICD10-I70.0) and Emphysema (ICD10-J43.9). Electronically Signed: By: Keith Rake M.D. On: 11-25-2019 20:28   CARDIAC CATHETERIZATION  Result Date: 11-25-2019  Mid RCA lesion  is 30% stenosed.  Left Heart Catheterization 25-Nov-2019: Left ventricle: Hyperdynamic.  Mild intraventricular pressure gradient suspected.  EF 70%.  No significant mitral regurgitation.  No wall motion abnormality. Left main: Nonexistent.  Separate ostia for LAD and circumflex. LAD: Smooth and normal. Circumflex: Proximal to mid circumflex has a 40 to 50% stenosis.  Otherwise smooth and normal. RCA: Proximal RCA 20% stenosis, mid 30% stenosis.  But otherwise smooth and normal. Right femoral arterial access closed with minx.  40 mL of contrast utilized.   US BIOPSY (LIVER)  Result Date: 10/28/2019 INDICATION: 49 year old female with neurologic death referred for liver biopsy EXAM: IMAGE GUIDED BIOPSY FOR MEDICAL RENAL PURPOSE MEDICATIONS: None. ANESTHESIA/SEDATION: None FLUOROSCOPY TIME:  None COMPLICATIONS: None PROCEDURE: The procedure, risks, benefits, and alternatives were explained to the patient. Questions regarding the procedure were encouraged and answered. The patient understands and consents to the procedure. Ultrasound survey of the right liver lobe performed with images stored and sent to PACs. The right upper quadrant was prepped with chlorhexidine in a sterile fashion, and a sterile drape was applied covering the operative field. A sterile gown and sterile gloves were used for the procedure. A 17 gauge introducer needle was advanced under ultrasound guidance in an intercostal location into the right liver  lobe. The stylet was removed, and 4 separate 18 gauge core biopsy were retrieved. Samples were placed into fresh specimen for transportation to the lab. The needle was removed, and a final ultrasound image was performed. The patient tolerated the procedure well and remained hemodynamically stable throughout. No complications were encountered and no significant blood loss was encounter. IMPRESSION: Status post ultrasound-guided medical liver biopsy. Signed, Dulcy Fanny. Dellia Nims, RPVI [facility] Rad[facility] Electronically Signed   By: Corrie Mckusick D.O.   On: 10/28/2019 16:42   DG CHEST PORT 1 VIEW  Result Date: 10/28/2019 CLINICAL DATA:  Intubation EXAM: PORTABLE CHEST 1 VIEW COMPARISON:  Yesterday FINDINGS: Endotracheal tube tip is 6 cm above the carina, similar to yesterday. New left IJ line with tip at the upper cavoatrial junction. The enteric tube at least reaches the stomach. Streaky lower lobe opacity consistent with atelectasis. No effusion or pneumothorax seen. IMPRESSION: 1. Hardware as described above. 2. Increased atelectasis. Electronically Signed   By: Monte Fantasia M.D.   On: 10/28/2019 10:09   DG CHEST PORT 1 VIEW  Result Date: 11/21/19 CLINICAL DATA:  Brain hemorrhage. Endotracheal tube placement. EXAM: PORTABLE CHEST 1 VIEW COMPARISON:  October 24, 2019. FINDINGS: Stable cardiomediastinal silhouette. Endotracheal and nasogastric tubes are in grossly good position. No pneumothorax or pleural effusion is noted. Lungs are clear. Bony thorax is unremarkable. IMPRESSION: Stable support apparatus. No acute cardiopulmonary abnormality seen. Electronically Signed   By: Marijo Conception M.D.   On: 2019-11-21 08:42   DG CHEST PORT 1 VIEW  Result Date: 10/24/2019 CLINICAL DATA:  Evaluate ETT EXAM: PORTABLE CHEST 1 VIEW COMPARISON:  October 23, 2019 FINDINGS: The ETT is in good position. The NG tube terminates below today's film. The right IJ is stable. No pneumothorax. No focal infiltrates or overt edema. Stable cardiomediastinal silhouette. IMPRESSION: 1. Support apparatus as above. 2. No other acute interval changes. Electronically Signed   By: Dorise Bullion III M.D   On: 10/24/2019 12:02   DG CHEST PORT 1 VIEW  Result Date: 10/23/2019 CLINICAL DATA:  49 year old female intubated. Code stroke presentation on 10/07/2019 with intracranial hemorrhage. EXAM: PORTABLE CHEST 1 VIEW COMPARISON:  Portable chest 10/20/2019 and earlier.  FINDINGS: Portable AP semi upright view at 0547 hours. Intubated, ET tube tip in good position between the level the clavicles and carina. Enteric tube courses into the stomach, tip not included. Stable right IJ central line. Stable cardiac size at the upper limits of normal. Other mediastinal contours are within normal limits. Streaky retrocardiac opacity is stable compatible with subsegmental lower lobe atelectasis. Elsewhere when Allowing for portable technique the lungs are clear. Paucity of bowel gas. No acute osseous abnormality identified. IMPRESSION: 1.  Stable lines and tubes. 2. Stable left lower lobe atelectasis. No new cardiopulmonary abnormality. Electronically Signed   By: Genevie Ann M.D.   On: 10/23/2019 07:50   DG Chest Port 1 View  Result Date: 10/20/2019 CLINICAL DATA:  Intercerebral hemorrhage. EXAM: PORTABLE CHEST 1 VIEW COMPARISON:  10/19/2019 FINDINGS: The endotracheal tube, NG tube and right IJ catheters are stable. The cardiac silhouette, mediastinal and hilar contours are within normal limits and stable. Much improved lung aeration with resolving edema and atelectasis. IMPRESSION: 1. Stable support apparatus. 2. Much improved lung aeration with resolving edema and atelectasis. Electronically Signed   By: Marijo Sanes M.D.   On: 10/20/2019 08:18   DG Chest Portable 1 View  Result Date: 10/19/2019 CLINICAL DATA:  Central  line placement EXAM: PORTABLE CHEST 1 VIEW COMPARISON:  Radiograph 10/10/2019 FINDINGS: Endotracheal tube is low within the trachea, approximately 1.4 cm from the carina. Consider retraction 2 cm to the mid trachea. Transesophageal tube tip and side port distal to the GE junction, below the level of imaging. A right IJ approach central venous catheter tip terminates at the superior cavoatrial junction. Telemetry leads overlie the chest. Bilateral mixed interstitial and hazy opacity is similar to prior. No pneumothorax or effusion. Cardiomediastinal contours are stable.  No acute osseous or soft tissue abnormality. Degenerative changes are present in the imaged spine and shoulders. IMPRESSION: 1. Endotracheal tube low within the trachea, approximately 1.4 cm from the carina. Consider retraction 2 cm to the mid trachea. 2. Right IJ central venous catheter tip terminates at the superior cavoatrial junction. 3. Stable bilateral mixed interstitial and hazy opacity. Could reflect edema and/or infection. Electronically Signed   By: Lovena Le M.D.   On: 10/19/2019 01:31   DG Chest Port 1 View  Result Date: 10/19/2019 CLINICAL DATA:  Altered mental status EXAM: PORTABLE CHEST 1 VIEW COMPARISON:  03/12/2016 FINDINGS: Endotracheal tube is 1.4 cm above the carina. NG tube is in the stomach. Bilateral airspace disease. Heart is upper limits normal in size. No effusions or acute bony abnormality. IMPRESSION: Bilateral airspace disease could reflect edema or infection. Electronically Signed   By: Rolm Baptise M.D.   On: 10/19/2019 00:06   DG Abd Acute W/Chest  Result Date: 10/06/2019 CLINICAL DATA:  Incorrect instrument count. Organ procurement. EXAM: DG ABDOMEN ACUTE W/ 1V CHEST COMPARISON:  None. FINDINGS: Chest: Endotracheal tube, enteric tube and left central line. No evidence of retained surgical instrument in the thorax. Abdomen: Foley catheter projects over the pelvis. External artifact over the lower pelvis with coiled monitoring device. No evidence of retained surgical instrument in the abdomen or pelvis. IMPRESSION: No evidence of retained surgical instrument. Results were called to the operating room at the time of the exam. Electronically Signed   By: Keith Rake M.D.   On: 10/21/2019 21:26   ECHOCARDIOGRAM COMPLETE  Result Date: 10/19/2019    ECHOCARDIOGRAM REPORT   Patient Name:   KANA REIMANN Date of Exam: 10/19/2019 Medical Rec #:  [id number]     Height:       65.0 in Accession #:    [id number]    Weight:       228.8 lb Date of Birth:  17-Feb-1971     BSA:           2.095 m Patient Age:    49 years      BP:           130/65 mmHg Patient Gender: F             HR:           86 bpm. Exam Location:  Inpatient Procedure: 2D Echo Indications:    Stroke 434.91 / I163.9  History:        Patient has no prior history of Echocardiogram examinations.                 Blood pressure elevated without history of HTN, Intracerebral                 hemorrhage, Respiratory failure , she has not sought medical                 care or been taking medications for years per her sister.  Sonographer:  Leavy Cella Referring Phys: [id number] Elliott XU IMPRESSIONS  1. Left ventricular ejection fraction, by estimation, is 60 to 65%. The left ventricle has normal function. The left ventricle has no regional wall motion abnormalities. Left ventricular diastolic parameters are consistent with Grade I diastolic dysfunction (impaired relaxation). Elevated left atrial pressure.  2. Right ventricular systolic function is normal. The right ventricular size is normal.  3. Thickened anterior chordae. The mitral valve is normal in structure. No evidence of mitral valve regurgitation. No evidence of mitral stenosis.  4. The aortic valve is normal in structure. Aortic valve regurgitation is not visualized. Mild aortic valve sclerosis is present, with no evidence of aortic valve stenosis.  5. The inferior vena cava is normal in size with greater than 50% respiratory variability, suggesting right atrial pressure of 3 mmHg. Conclusion(s)/Recommendation(s): No intracardiac source of embolism detected on this transthoracic study. A transesophageal echocardiogram is recommended to exclude cardiac source of embolism if clinically indicated. FINDINGS  Left Ventricle: Left ventricular ejection fraction, by estimation, is 60 to 65%. The left ventricle has normal function. The left ventricle has no regional wall motion abnormalities. The left ventricular internal cavity size was normal in size. There is  no left  ventricular hypertrophy. Left ventricular diastolic parameters are consistent with Grade I diastolic dysfunction (impaired relaxation). Elevated left atrial pressure. Right Ventricle: The right ventricular size is normal. No increase in right ventricular wall thickness. Right ventricular systolic function is normal. Left Atrium: Left atrial size was normal in size. Right Atrium: Right atrial size was normal in size. Pericardium: There is no evidence of pericardial effusion. Mitral Valve: Thickened anterior chordae. The mitral valve is normal in structure. Normal mobility of the mitral valve leaflets. No evidence of mitral valve regurgitation. No evidence of mitral valve stenosis. Tricuspid Valve: The tricuspid valve is normal in structure. Tricuspid valve regurgitation is not demonstrated. No evidence of tricuspid stenosis. Aortic Valve: The aortic valve is normal in structure. Aortic valve regurgitation is not visualized. Mild aortic valve sclerosis is present, with no evidence of aortic valve stenosis. Pulmonic Valve: The pulmonic valve was normal in structure. Pulmonic valve regurgitation is not visualized. No evidence of pulmonic stenosis. Aorta: The aortic root is normal in size and structure. Venous: The inferior vena cava is normal in size with greater than 50% respiratory variability, suggesting right atrial pressure of 3 mmHg. IAS/Shunts: No atrial level shunt detected by color flow Doppler.  LEFT VENTRICLE PLAX 2D LVIDd:         5.60 cm  Diastology LVIDs:         3.40 cm  LV e' lateral:   5.66 cm/s LV PW:         1.10 cm  LV E/e' lateral: 17.1 LV IVS:        1.30 cm  LV e' medial:    5.44 cm/s LVOT diam:     2.10 cm  LV E/e' medial:  17.8 LVOT Area:     3.46 cm  RIGHT VENTRICLE RV S prime:     14.90 cm/s TAPSE (M-mode): 1.9 cm LEFT ATRIUM             Index       RIGHT ATRIUM           Index LA diam:        3.50 cm 1.67 cm/m  RA Area:     10.90 cm LA Vol (A2C):   45.4 ml 21.67 ml/m RA Volume:   21.80 ml  10.41 ml/m LA Vol (A4C):   48.1 ml 22.96 ml/m LA Biplane Vol: 47.5 ml 22.68 ml/m   AORTA Ao Root diam: 3.60 cm MITRAL VALVE MV Area (PHT): 3.17 cm     SHUNTS MV Decel Time: 239 msec     Systemic Diam: 2.10 cm MV E velocity: 97.00 cm/s MV A velocity: 113.00 cm/s MV E/A ratio:  0.86 Candee Furbish MD Electronically signed by Candee Furbish MD Signature Date/Time: 10/19/2019/2:31:16 PM    Final    CT HEAD CODE STROKE WO CONTRAST  Result Date: 10/10/2019 CLINICAL DATA:  Code stroke.  Left facial droop EXAM: CT HEAD WITHOUT CONTRAST TECHNIQUE: Contiguous axial images were obtained from the base of the skull through the vertex without intravenous contrast. COMPARISON:  Head CT 03/24/2015 FINDINGS: Examination degraded by motion. Brain: There is a large intraparenchymal hematoma centered in the left basal ganglia measuring 5.0 x 3.0 x 4.1 cm (volume = 32 cm^3). There is subarachnoid extension into the lateral ventricles and inferiorly into the fourth ventricle. There is entrapment of the right lateral ventricle with dilatation of atrium and temporal horn. There is rightward midline shift at the level of the foramina of [place] that measures 6 mm. Vascular: No abnormal hyperdensity of the major intracranial arteries or dural venous sinuses. No intracranial atherosclerosis. Skull: The visualized skull base, calvarium and extracranial soft tissues are normal. Sinuses/Orbits: No fluid levels or advanced mucosal thickening of the visualized paranasal sinuses. No mastoid or middle ear effusion. The orbits are normal. IMPRESSION: 1. 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 2. Right lateral ventricle entrapment with dilatation of the atrium and temporal horn. *Dr. Kerney Elbe paged at 11:34 p.m. on 10/05/2019. Electronically Signed   By: Ulyses Jarred M.D.   On: 10/19/2019 23:34    Microbiology Recent Results (from the past 240 hour(s))   Culture, bal-quantitative     Status: Abnormal   Collection Time: Nov 07, 2019  6:10 PM   Specimen: Bronchial Brush; Respiratory  Result Value Ref Range Status   Specimen Description BRONCHIAL BRUSHING  Final   Special Requests LEFT  Final   Gram Stain   Final    WBC PRESENT,BOTH PMN AND MONONUCLEAR GRAM POSITIVE COCCI GRAM POSITIVE RODS CYTOSPIN SMEAR Performed at [facility], [address], [place], [place] [address]    Culture 50,000 COLONIES/mL STAPHYLOCOCCUS AUREUS (A)  Final   Report Status 10/26/2019 FINAL  Final   Organism ID, Bacteria STAPHYLOCOCCUS AUREUS (A)  Final      Susceptibility   Staphylococcus aureus - MIC*    CIPROFLOXACIN <=0.5 SENSITIVE Sensitive     ERYTHROMYCIN >=8 RESISTANT Resistant     GENTAMICIN <=0.5 SENSITIVE Sensitive     OXACILLIN <=0.25 SENSITIVE Sensitive     TETRACYCLINE >=16 RESISTANT Resistant     VANCOMYCIN <=0.5 SENSITIVE Sensitive     TRIMETH/SULFA <=10 SENSITIVE Sensitive     CLINDAMYCIN <=0.25 SENSITIVE Sensitive     RIFAMPIN <=0.5 SENSITIVE Sensitive     Inducible Clindamycin NEGATIVE Sensitive     * 50,000 COLONIES/mL STAPHYLOCOCCUS AUREUS  Culture, bal-quantitative     Status: Abnormal   Collection Time: 2019-11-07  6:10 PM   Specimen: Bronchial Brush; Respiratory  Result Value Ref Range Status   Specimen Description BRONCHIAL BRUSHING  Final   Special Requests RIGHT  Final   Gram Stain   Final    WBC PRESENT,BOTH PMN AND MONONUCLEAR GRAM POSITIVE COCCI GRAM POSITIVE  RODS CYTOSPIN SMEAR Performed at [facility], [place] [address] [place], [place] [address]    Culture 20,000 COLONIES/mL STAPHYLOCOCCUS AUREUS (A)  Final   Report Status 10/10/2019 FINAL  Final   Organism ID, Bacteria STAPHYLOCOCCUS AUREUS (A)  Final      Susceptibility   Staphylococcus aureus - MIC*    CIPROFLOXACIN <=0.5 SENSITIVE Sensitive     ERYTHROMYCIN >=8 RESISTANT Resistant     GENTAMICIN <=0.5 SENSITIVE Sensitive     OXACILLIN 0.5 SENSITIVE  Sensitive     TETRACYCLINE >=16 RESISTANT Resistant     VANCOMYCIN 1 SENSITIVE Sensitive     TRIMETH/SULFA <=10 SENSITIVE Sensitive     CLINDAMYCIN <=0.25 SENSITIVE Sensitive     RIFAMPIN <=0.5 SENSITIVE Sensitive     Inducible Clindamycin NEGATIVE Sensitive     * 20,000 COLONIES/mL STAPHYLOCOCCUS AUREUS  Urine culture     Status: None   Collection Time: 05-Nov-2019  8:30 PM   Specimen: Urine, Random  Result Value Ref Range Status   Specimen Description URINE, RANDOM  Final   Special Requests NONE  Final   Culture   Final    NO GROWTH Performed at [facility], [place] [address] [place], [place] [address]    Report Status 10/28/2019 FINAL  Final  Culture, blood (routine x 2) with sensitivity     Status: None   Collection Time: 11/05/2019  8:49 PM   Specimen: BLOOD RIGHT HAND  Result Value Ref Range Status   Specimen Description BLOOD RIGHT HAND  Final   Special Requests   Final    BOTTLES DRAWN AEROBIC ONLY Blood Culture results may not be optimal due to an inadequate volume of blood received in culture bottles   Culture   Final    NO GROWTH 5 DAYS Performed at [facility], [place] [address], [place], [place] [address]    Report Status 11/01/2019 FINAL  Final  Culture, blood (routine x 2)     Status: None   Collection Time: 10/17/2019  3:19 AM   Specimen: BLOOD RIGHT HAND  Result Value Ref Range Status   Specimen Description BLOOD RIGHT HAND  Final   Special Requests AEROBIC BOTTLE ONLY Blood Culture adequate volume  Final   Culture   Final    NO GROWTH 5 DAYS Performed at [facility], [place] [address], [place], [place] [address]    Report Status 11/03/2019 FINAL  Final  Culture, blood (routine x 2)     Status: None   Collection Time: 10/03/2019  3:22 AM   Specimen: BLOOD RIGHT HAND  Result Value Ref Range Status   Specimen Description BLOOD RIGHT HAND  Final   Special Requests AEROBIC BOTTLE ONLY Blood Culture adequate volume  Final   Culture   Final    NO GROWTH  5 DAYS Performed at [facility], [place] [address], [place], [place] [address]    Report Status 11/03/2019 FINAL  Final    Lab Basic Metabolic Panel: Recent Labs  Lab 10/30/2019 1708  NA 156*  K 3.6   Liver Function Tests: No results for input(s): AST, ALT, ALKPHOS, BILITOT, PROT, ALBUMIN in the last 168 hours. No results for input(s): LIPASE, AMYLASE in the last 168 hours. No results for input(s): AMMONIA in the last 168 hours. CBC: Recent Labs  Lab 10/07/2019 1708  HGB 10.9*  HCT 32.0*   Cardiac Enzymes: No results for input(s): CKTOTAL, CKMB, CKMBINDEX, TROPONINI in the last 168  hours. Sepsis Labs: No results for input(s): PROCALCITON, WBC, LATICACIDVEN in the last 168 hours.  Procedures/Operations  Mechanical ventilation, coronary catheterization, bronchoscopy, organ retrieval.   Kipp Brood 11/05/2019, 4:30 PM

## 2020-12-21 IMAGING — CT CT HEAD W/O CM
4 series · 15 of 47 positions shown, 17 images · non-contrast
Comparison: Prior CT from 10/21/2019

CLINICAL DATA: Follow-up examination for acute intracranial
hemorrhage.

EXAM:
CT HEAD WITHOUT CONTRAST
TECHNIQUE: Contiguous axial images were obtained from the base of the skull
through the vertex without intravenous contrast.

[Series 3: head wo · axial · 0.43mm/px · z∈[-74,+46]mm · 7 of 34 slices shown, 9 images]
[im 5/34  brain]
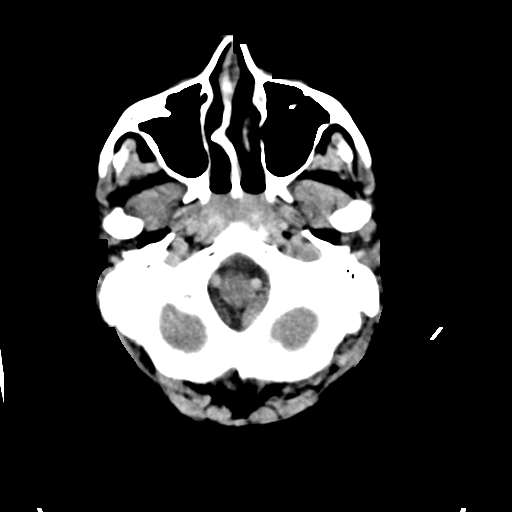
[im 5/34  bone]
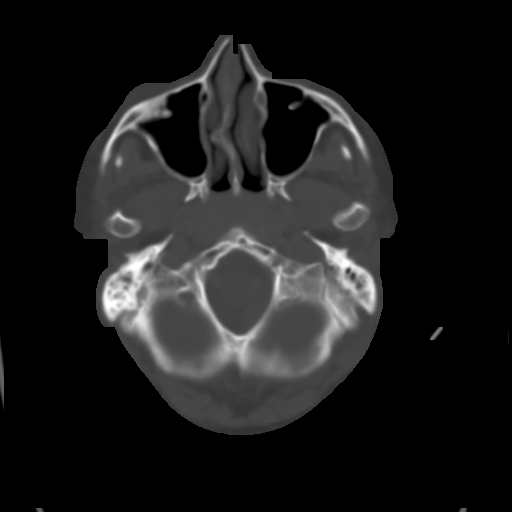
[im 9/34  brain]
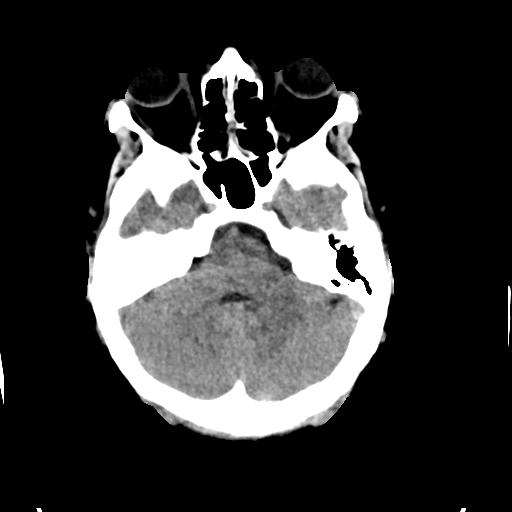
[im 13/34  brain]
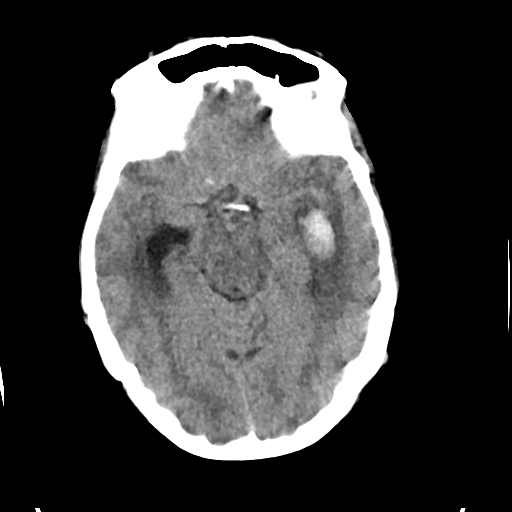
[im 17/34  brain]
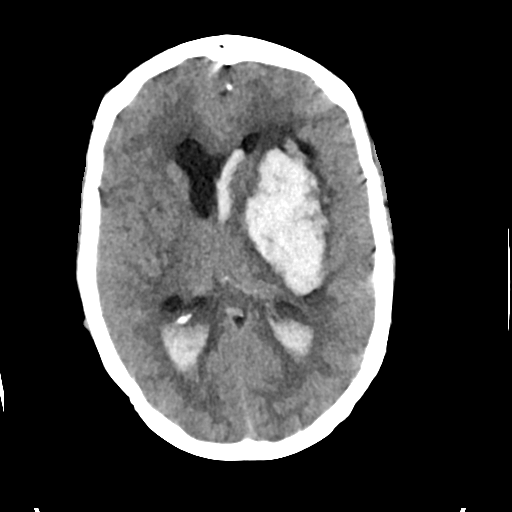
[im 21/34  brain]
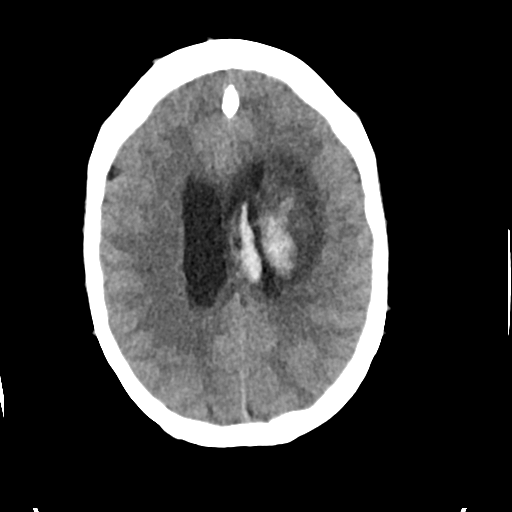
[im 21/34  bone]
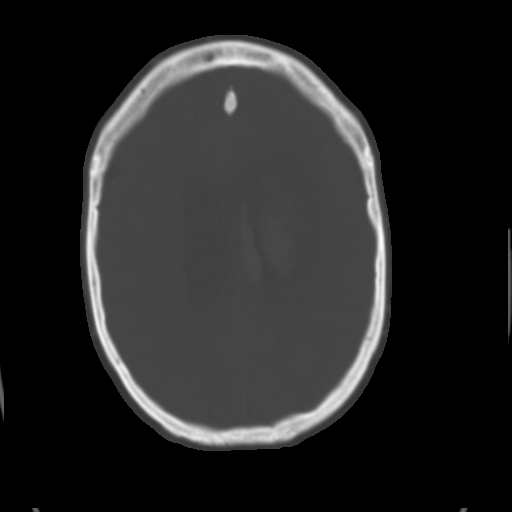
[im 25/34  brain]
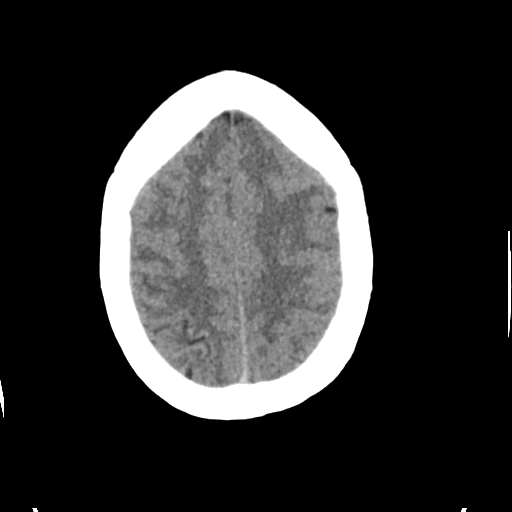
[im 29/34  brain]
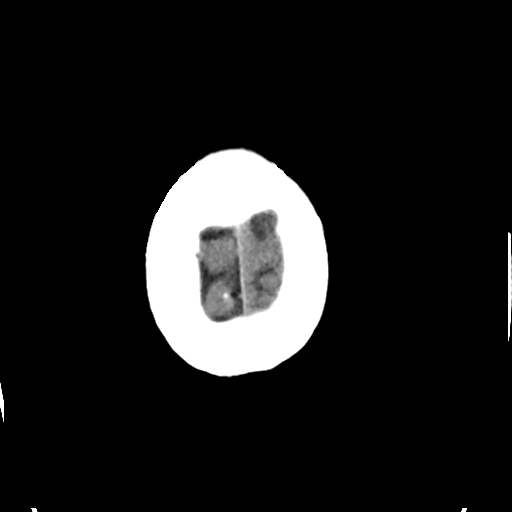

[Series 4: head bone · axial · 0.43mm/px · z∈[-78,-62]mm · 2 of 83 slices shown]
[im 9/83  bone]
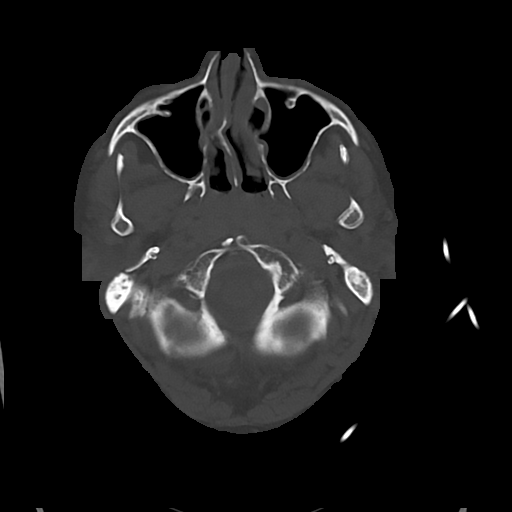
[im 17/83  bone]
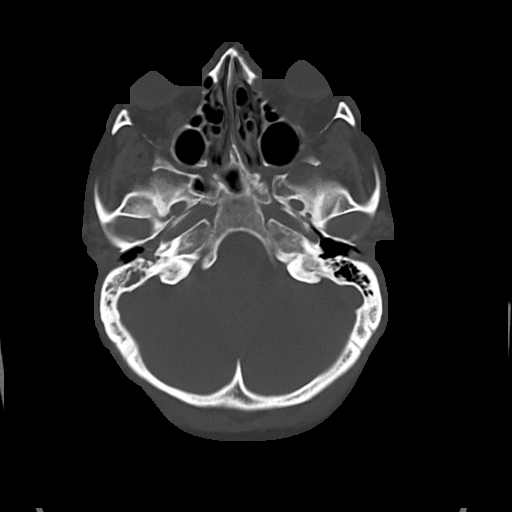

[Series 5: cor soft · coronal · 0.31mm/px · 3 of 74 slices shown]
[im 25/74  brain]
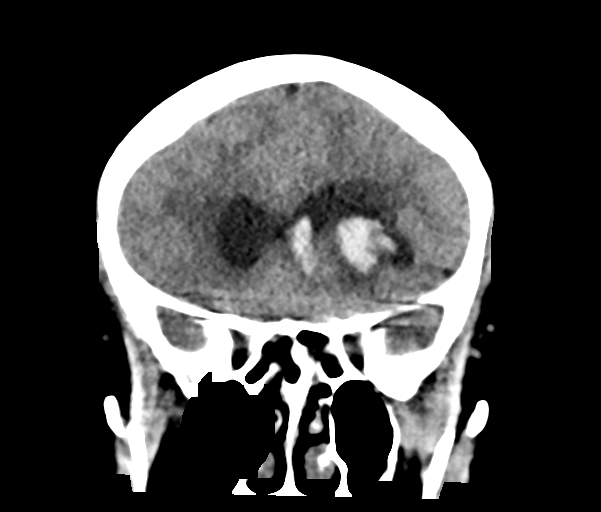
[im 33/74  brain]
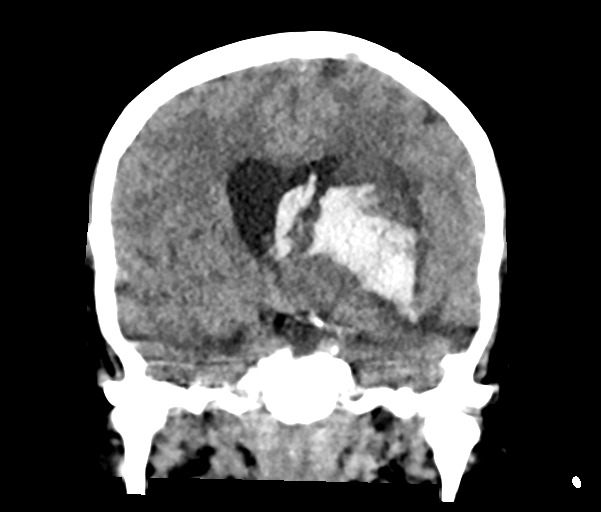
[im 41/74  brain]
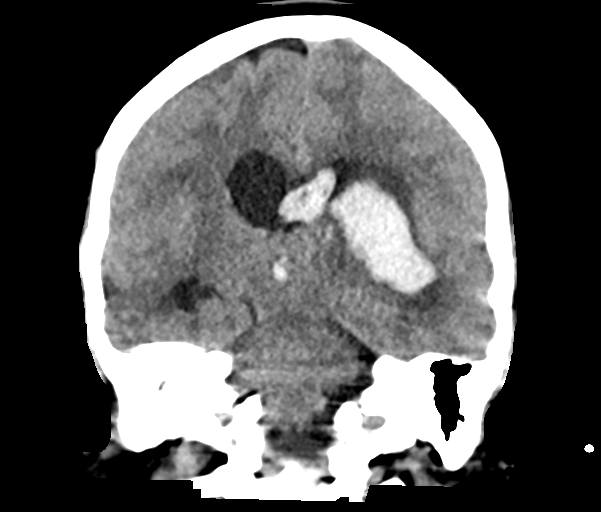

[Series 6: sag soft · sagittal · 0.38mm/px · 3 of 53 slices shown]
[im 18/53  brain]
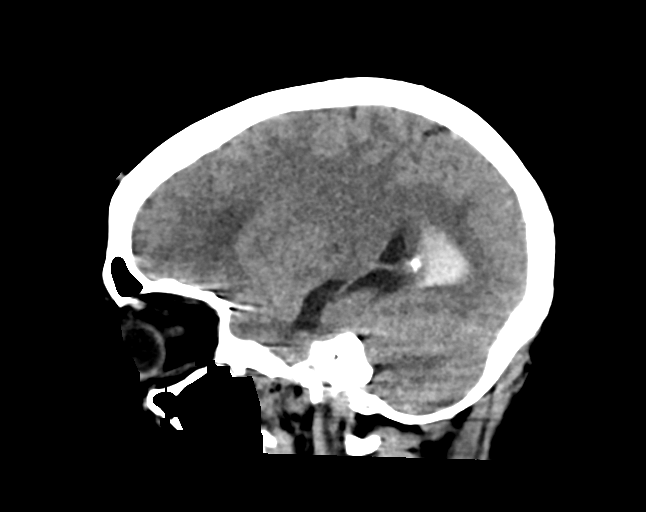
[im 27/53  brain]
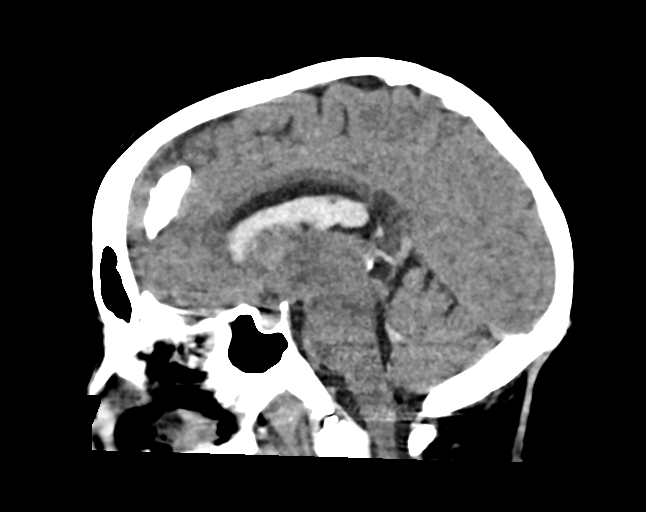
[im 35/53  brain]
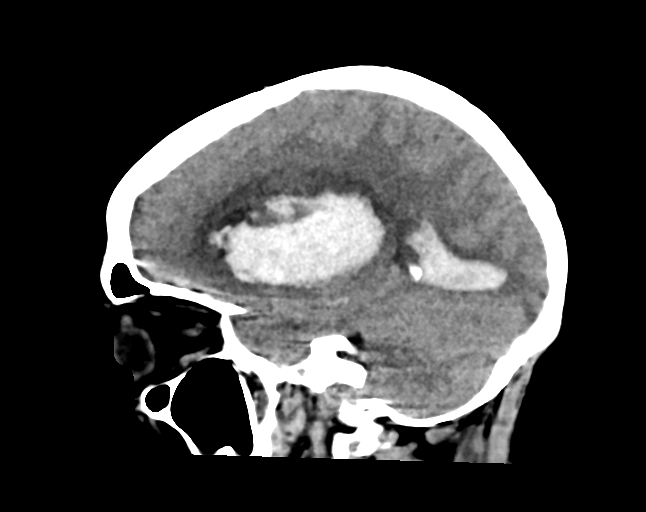

[15 of 47 positions shown; findings below may reference images not displayed]

FINDINGS: Brain: Large intraparenchymal hemorrhage centered at the left
lentiform nucleus again seen, not significantly interval changed in
size measuring approximately 6.8 x 3.5 x 5.0 cm. Surrounding
low-density vasogenic edema with regional mass effect is similar to
perhaps slightly increased. Associated left-to-right midline shift
measures 7 mm, relatively stable. Intraventricular extension with
blood seen throughout the lateral and third ventricles again seen.
Mild ventriculomegaly with asymmetric dilatation of the right
lateral ventricle is relatively stable. Probable mild transependymal
flow of CSF also relatively unchanged.

No other new intracranial hemorrhage. No other acute large vessel
territory infarct. Small chronic infarct at the inferior right
cerebellum noted, stable. No extra-axial fluid collection.

Vascular: No hyperdense vessel.

Skull: Scalp soft tissues and calvarium within normal limits.

Sinuses/Orbits: Globes and orbital soft tissues demonstrate no acute
finding. Scattered chronic mucosal thickening noted within the
paranasal sinuses. Chronic appearing right mastoid and middle ear
effusion noted, unchanged.

Other: None.
IMPRESSION: 1. No significant interval change in large left basal ganglia
intraparenchymal hemorrhage with intraventricular extension.
Associated ventriculomegaly with probable transependymal flow of CSF
relatively unchanged as well. Stable 7 mm left-to-right shift.
2. No other new acute intracranial abnormality.

## 2020-12-25 IMAGING — US US BIOPSY CORE LIVER
1 series · 8 of 8 positions shown · non-contrast
Comparison: none

INDICATION: 48-year-old female with neurologic death referred for liver biopsy

[Series 1: us biopsy core liver · 8 of 8 slices shown]
[im 1/8]
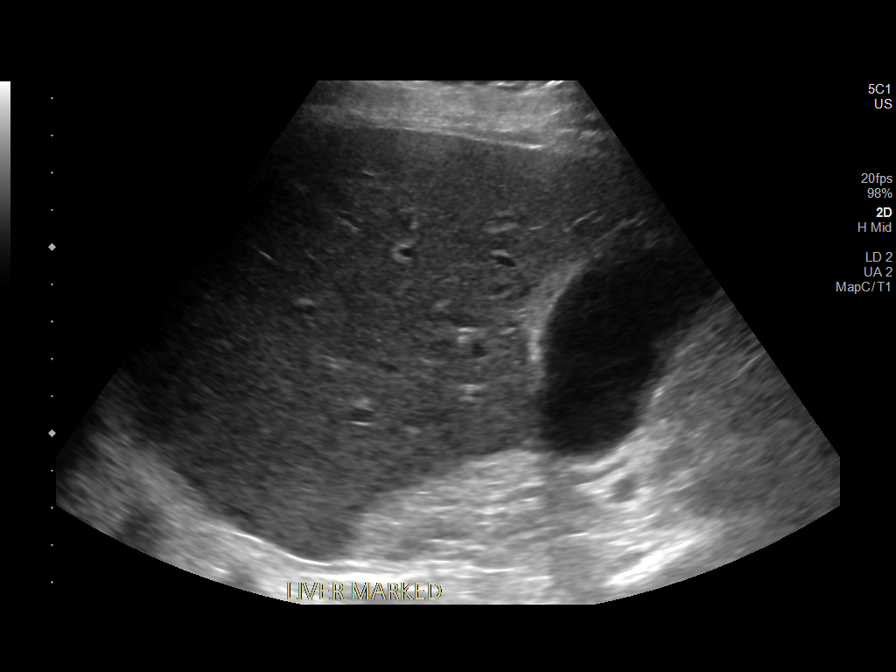
[im 2/8]
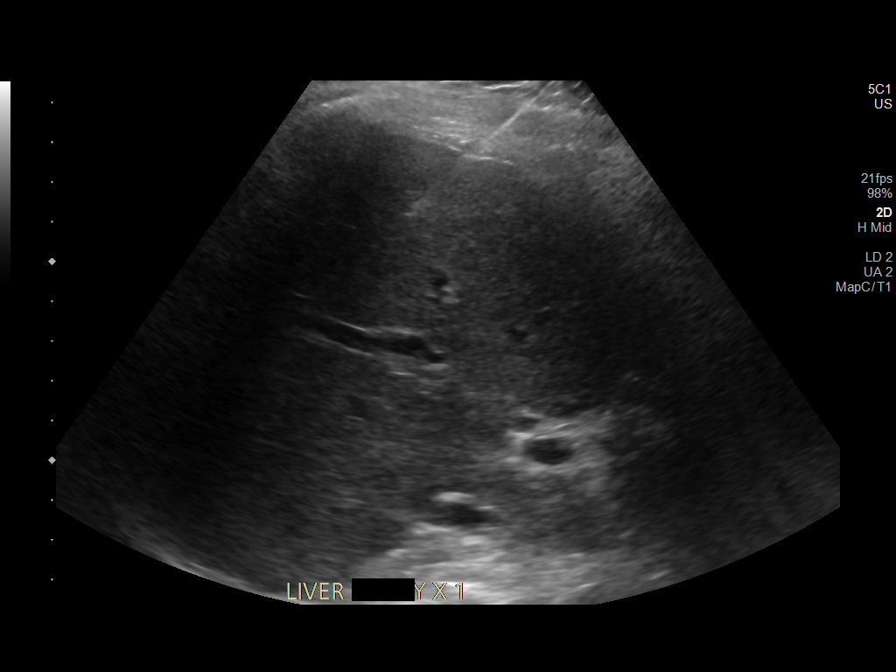
[im 3/8]
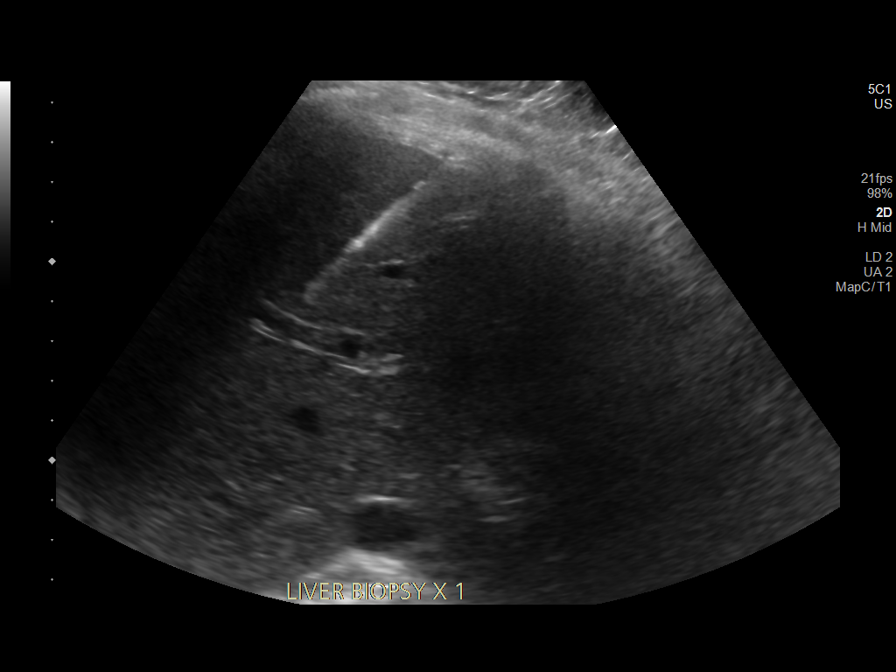
[im 4/8]
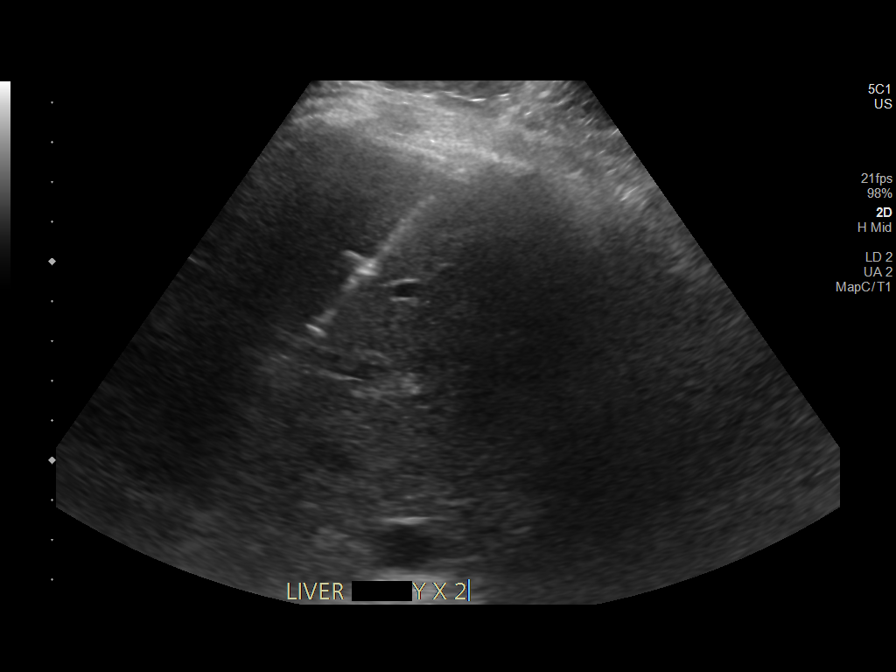
[im 5/8]
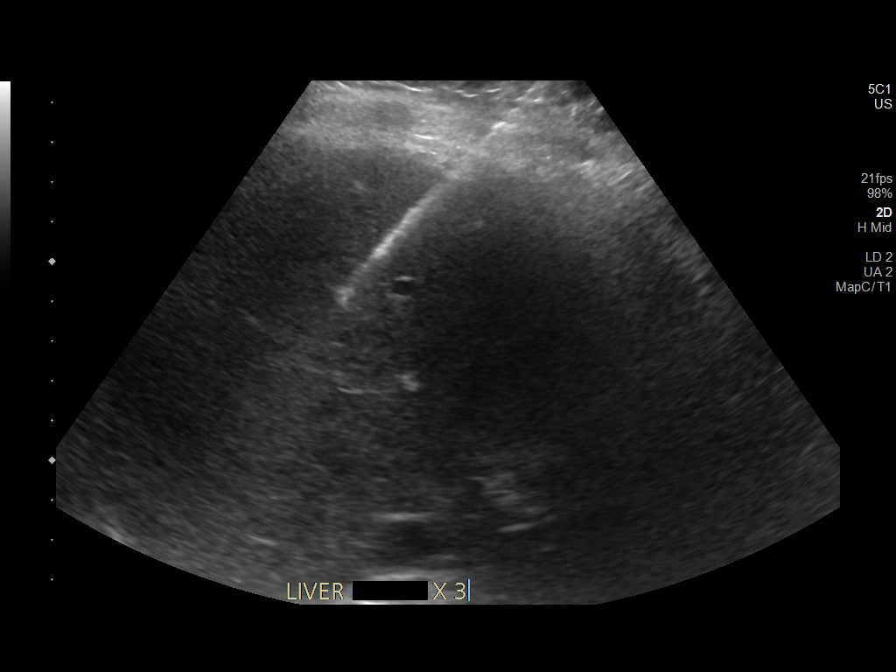
[im 6/8]
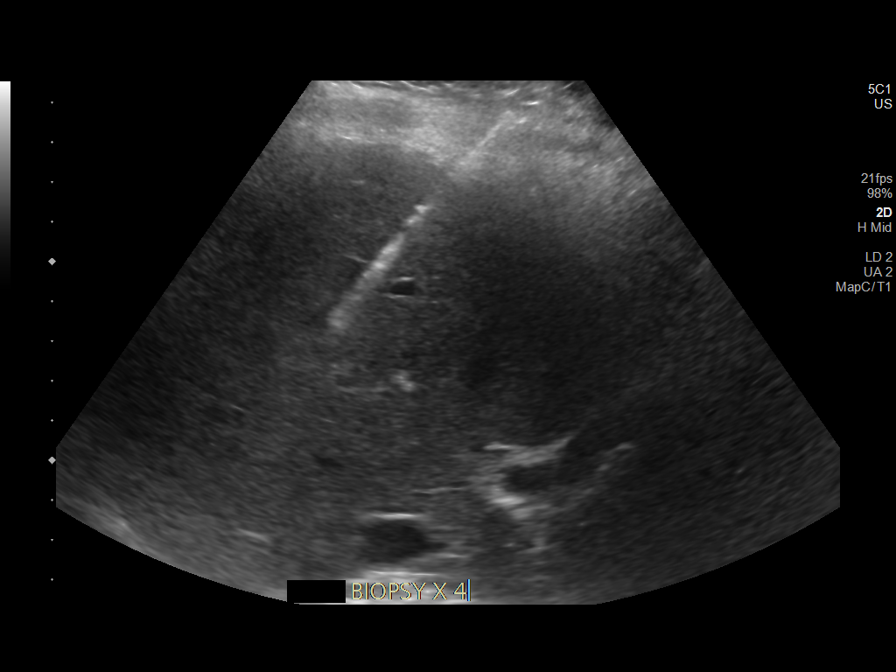
[im 7/8]
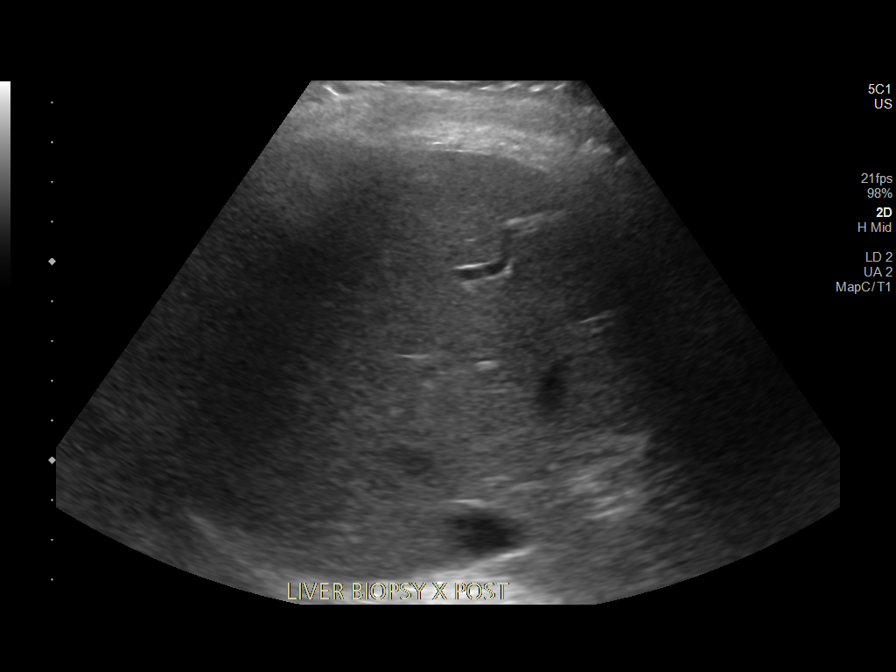
[im 8/8]
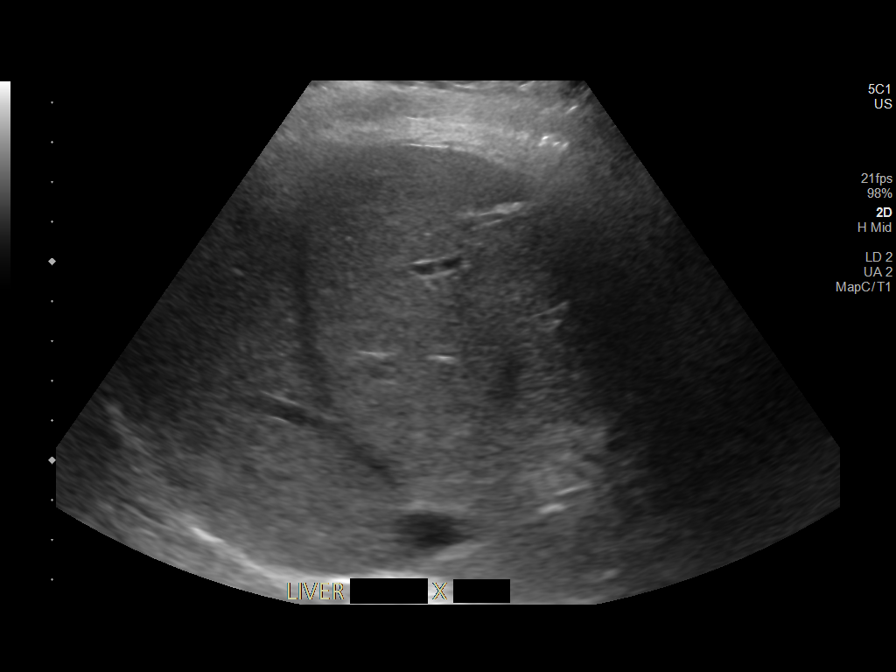

[8 of 8 positions shown; findings below may reference images not displayed]

EXAM:
IMAGE GUIDED BIOPSY FOR MEDICAL RENAL PURPOSE

MEDICATIONS:
None.

ANESTHESIA/SEDATION:
None

FLUOROSCOPY TIME:  None

COMPLICATIONS:
None

PROCEDURE:
The procedure, risks, benefits, and alternatives were explained to
the patient. Questions regarding the procedure were encouraged and
answered. The patient understands and consents to the procedure.

Ultrasound survey of the right liver lobe performed with images
stored and sent to PACs.

The right upper quadrant was prepped with chlorhexidine in a sterile
fashion, and a sterile drape was applied covering the operative
field. A sterile gown and sterile gloves were used for the
procedure.

A 17 gauge introducer needle was advanced under ultrasound guidance
in an intercostal location into the right liver lobe. The stylet was
removed, and 4 separate 18 gauge core biopsy were retrieved. Samples
were placed into fresh specimen for transportation to the lab.

The needle was removed, and a final ultrasound image was performed.

The patient tolerated the procedure well and remained
hemodynamically stable throughout.

No complications were encountered and no significant blood loss was
encounter.
IMPRESSION: Status post ultrasound-guided medical liver biopsy.
